# Patient Record
Sex: Female | Born: 1960 | Race: White | Hispanic: No | Marital: Married | Smoking: Former smoker
Health system: Southern US, Community
[De-identification: ages and names within clinical notes are randomized; demographics above are authoritative.]

## PROBLEM LIST (undated history)

## (undated) DIAGNOSIS — J449 Chronic obstructive pulmonary disease, unspecified: Secondary | ICD-10-CM

## (undated) HISTORY — PX: OTHER SURGICAL HISTORY: SHX169

## (undated) HISTORY — PX: CHOLECYSTECTOMY: SHX55

## (undated) HISTORY — PX: LUMBAR FUSION: SHX111

---

## 1983-06-11 HISTORY — PX: TOTAL ABDOMINAL HYSTERECTOMY: SHX209

## 2007-07-13 ENCOUNTER — Emergency Department: Payer: Self-pay | Admitting: Internal Medicine

## 2007-10-06 ENCOUNTER — Ambulatory Visit: Payer: Self-pay | Admitting: Internal Medicine

## 2008-01-18 ENCOUNTER — Ambulatory Visit: Payer: Self-pay | Admitting: Internal Medicine

## 2008-03-01 ENCOUNTER — Emergency Department: Payer: Self-pay | Admitting: Emergency Medicine

## 2009-04-28 ENCOUNTER — Ambulatory Visit: Payer: Self-pay | Admitting: Otolaryngology

## 2009-05-08 ENCOUNTER — Ambulatory Visit: Payer: Self-pay | Admitting: Otolaryngology

## 2010-06-20 ENCOUNTER — Ambulatory Visit: Payer: Self-pay | Admitting: Otolaryngology

## 2010-07-09 ENCOUNTER — Ambulatory Visit: Payer: Self-pay | Admitting: Otolaryngology

## 2011-01-31 ENCOUNTER — Emergency Department: Payer: Self-pay | Admitting: Internal Medicine

## 2011-02-19 ENCOUNTER — Inpatient Hospital Stay: Payer: Self-pay | Admitting: Internal Medicine

## 2011-05-16 ENCOUNTER — Ambulatory Visit: Payer: Self-pay | Admitting: Otolaryngology

## 2011-06-11 HISTORY — PX: ESOPHAGOGASTRODUODENOSCOPY: SHX1529

## 2011-06-11 HISTORY — PX: COLONOSCOPY: SHX174

## 2011-08-01 ENCOUNTER — Observation Stay: Payer: Self-pay | Admitting: *Deleted

## 2011-08-01 LAB — COMPREHENSIVE METABOLIC PANEL
Albumin: 4.1 g/dL (ref 3.4–5.0)
Alkaline Phosphatase: 70 U/L (ref 50–136)
Anion Gap: 8 (ref 7–16)
BUN: 5 mg/dL — ABNORMAL LOW (ref 7–18)
Calcium, Total: 9.5 mg/dL (ref 8.5–10.1)
Chloride: 103 mmol/L (ref 98–107)
Co2: 29 mmol/L (ref 21–32)
EGFR (Non-African Amer.): >60
Glucose: 83 mg/dL (ref 65–99)
Potassium: 3.5 mmol/L (ref 3.5–5.1)
SGOT(AST): 20 U/L (ref 15–37)
SGPT (ALT): 25 U/L
Total Protein: 7.5 g/dL (ref 6.4–8.2)

## 2011-08-01 LAB — LIPASE, BLOOD: Lipase: 171 U/L (ref 73–393)

## 2011-08-01 LAB — CBC WITH DIFFERENTIAL/PLATELET
Basophil %: 1.5 %
Eosinophil #: 0 10*3/uL (ref 0.0–0.7)
Eosinophil %: 0.9 %
HGB: 14 g/dL (ref 12.0–16.0)
Lymphocyte #: 2 10*3/uL (ref 1.0–3.6)
MCH: 30.8 pg (ref 26.0–34.0)
MCHC: 33.4 g/dL (ref 32.0–36.0)
MCV: 92 fL (ref 80–100)
Monocyte #: 0.4 10*3/uL (ref 0.0–0.7)
Neutrophil %: 53.4 %
Platelet: 135 10*3/uL — ABNORMAL LOW (ref 150–440)
RBC: 4.56 10*6/uL (ref 3.80–5.20)
RDW: 12.6 % (ref 11.5–14.5)

## 2011-08-01 LAB — URINALYSIS, COMPLETE
Glucose,UR: NEGATIVE mg/dL (ref 0–75)
Leukocyte Esterase: NEGATIVE
Nitrite: NEGATIVE
Ph: 6 (ref 4.5–8.0)
Protein: NEGATIVE
RBC,UR: <1 /HPF (ref 0–5)
Squamous Epithelial: 1
WBC UR: <1 /HPF (ref 0–5)

## 2011-08-02 LAB — COMPREHENSIVE METABOLIC PANEL
Alkaline Phosphatase: 55 U/L (ref 50–136)
Bilirubin,Total: 0.3 mg/dL (ref 0.2–1.0)
Calcium, Total: 8.2 mg/dL — ABNORMAL LOW (ref 8.5–10.1)
Chloride: 107 mmol/L (ref 98–107)
Co2: 29 mmol/L (ref 21–32)
Creatinine: 0.62 mg/dL (ref 0.60–1.30)
EGFR (African American): >60
EGFR (Non-African Amer.): >60
Glucose: 96 mg/dL (ref 65–99)
Osmolality: 290 (ref 275–301)
SGOT(AST): 38 U/L — ABNORMAL HIGH (ref 15–37)
SGPT (ALT): 25 U/L

## 2011-08-02 LAB — CBC WITH DIFFERENTIAL/PLATELET
Basophil #: 0 10*3/uL (ref 0.0–0.1)
Basophil %: 0.1 %
Eosinophil #: 0 10*3/uL (ref 0.0–0.7)
HGB: 12.1 g/dL (ref 12.0–16.0)
Lymphocyte %: 37.7 %
MCH: 31.6 pg (ref 26.0–34.0)
MCHC: 34.3 g/dL (ref 32.0–36.0)
Monocyte #: 0.4 10*3/uL (ref 0.0–0.7)
Monocyte %: 8.1 %
Neutrophil #: 2.6 10*3/uL (ref 1.4–6.5)
Neutrophil %: 53.5 %
Platelet: 113 10*3/uL — ABNORMAL LOW (ref 150–440)
RBC: 3.82 10*6/uL (ref 3.80–5.20)
RDW: 13.5 % (ref 11.5–14.5)
WBC: 4.9 10*3/uL (ref 3.6–11.0)

## 2011-08-03 LAB — BASIC METABOLIC PANEL
Anion Gap: 8 (ref 7–16)
BUN: 7 mg/dL (ref 7–18)
Chloride: 106 mmol/L (ref 98–107)
EGFR (African American): >60
EGFR (Non-African Amer.): >60
Glucose: 115 mg/dL — ABNORMAL HIGH (ref 65–99)
Osmolality: 282 (ref 275–301)

## 2011-09-09 ENCOUNTER — Emergency Department: Payer: Self-pay | Admitting: *Deleted

## 2011-09-09 LAB — COMPREHENSIVE METABOLIC PANEL
Albumin: 3.6 g/dL (ref 3.4–5.0)
Alkaline Phosphatase: 77 U/L (ref 50–136)
BUN: 12 mg/dL (ref 7–18)
Bilirubin,Total: 0.3 mg/dL (ref 0.2–1.0)
Creatinine: 0.65 mg/dL (ref 0.60–1.30)
Glucose: 79 mg/dL (ref 65–99)
SGOT(AST): 35 U/L (ref 15–37)
Total Protein: 7.2 g/dL (ref 6.4–8.2)

## 2011-09-09 LAB — URINALYSIS, COMPLETE
Blood: NEGATIVE
Glucose,UR: NEGATIVE mg/dL (ref 0–75)
Leukocyte Esterase: NEGATIVE
Ph: 5 (ref 4.5–8.0)
Protein: NEGATIVE
Specific Gravity: 1.012 (ref 1.003–1.030)
Squamous Epithelial: NONE SEEN
WBC UR: 1 /HPF (ref 0–5)

## 2011-09-09 LAB — CBC
HCT: 39.6 % (ref 35.0–47.0)
MCH: 31.6 pg (ref 26.0–34.0)
RDW: 13.4 % (ref 11.5–14.5)
WBC: 5.9 10*3/uL (ref 3.6–11.0)

## 2011-09-09 LAB — PROTIME-INR: Prothrombin Time: 12.6 secs (ref 11.5–14.7)

## 2011-09-09 LAB — APTT: Activated PTT: <23 secs — ABNORMAL LOW (ref 23.6–35.9)

## 2011-09-17 ENCOUNTER — Inpatient Hospital Stay: Payer: Self-pay | Admitting: Internal Medicine

## 2011-09-17 LAB — APTT: Activated PTT: 27.8 secs (ref 23.6–35.9)

## 2011-09-17 LAB — COMPREHENSIVE METABOLIC PANEL
Albumin: 3.4 g/dL (ref 3.4–5.0)
BUN: 6 mg/dL — ABNORMAL LOW (ref 7–18)
Co2: 24 mmol/L (ref 21–32)
Creatinine: 0.71 mg/dL (ref 0.60–1.30)
EGFR (African American): >60
EGFR (Non-African Amer.): >60
Glucose: 141 mg/dL — ABNORMAL HIGH (ref 65–99)
Potassium: 3.3 mmol/L — ABNORMAL LOW (ref 3.5–5.1)
SGOT(AST): 44 U/L — ABNORMAL HIGH (ref 15–37)
Sodium: 139 mmol/L (ref 136–145)

## 2011-09-17 LAB — URINALYSIS, COMPLETE
Bacteria: NONE SEEN
Bilirubin,UR: NEGATIVE
Blood: NEGATIVE
Glucose,UR: NEGATIVE mg/dL (ref 0–75)
Leukocyte Esterase: NEGATIVE
Nitrite: NEGATIVE
Protein: NEGATIVE
RBC,UR: <1 /HPF (ref 0–5)
Squamous Epithelial: <1
WBC UR: <1 /HPF (ref 0–5)

## 2011-09-17 LAB — LIPASE, BLOOD: Lipase: 135 U/L (ref 73–393)

## 2011-09-17 LAB — CBC
MCH: 31.9 pg (ref 26.0–34.0)
MCHC: 34.7 g/dL (ref 32.0–36.0)
MCV: 92 fL (ref 80–100)
Platelet: 141 10*3/uL — ABNORMAL LOW (ref 150–440)
RDW: 13.1 % (ref 11.5–14.5)

## 2011-09-17 LAB — HEMOGLOBIN: HGB: 10 g/dL — ABNORMAL LOW (ref 12.0–16.0)

## 2011-09-17 LAB — HEMATOCRIT: HCT: 28.9 % — ABNORMAL LOW (ref 35.0–47.0)

## 2011-09-18 LAB — CBC WITH DIFFERENTIAL/PLATELET
Basophil %: 0.2 %
MCH: 31.3 pg (ref 26.0–34.0)
MCV: 93 fL (ref 80–100)
Monocyte %: 11.2 %
Neutrophil #: 1 10*3/uL — ABNORMAL LOW (ref 1.4–6.5)
Neutrophil %: 32.4 %
RDW: 13.3 % (ref 11.5–14.5)

## 2011-09-18 LAB — BASIC METABOLIC PANEL
BUN: 4 mg/dL — ABNORMAL LOW (ref 7–18)
Calcium, Total: 8.2 mg/dL — ABNORMAL LOW (ref 8.5–10.1)
Chloride: 112 mmol/L — ABNORMAL HIGH (ref 98–107)
Co2: 27 mmol/L (ref 21–32)
EGFR (African American): >60
Glucose: 83 mg/dL (ref 65–99)
Osmolality: 287 (ref 275–301)
Sodium: 146 mmol/L — ABNORMAL HIGH (ref 136–145)

## 2011-09-18 LAB — PROTIME-INR: INR: 0.9

## 2011-09-18 LAB — MAGNESIUM: Magnesium: 1.6 mg/dL — ABNORMAL LOW

## 2011-10-08 ENCOUNTER — Ambulatory Visit: Payer: Self-pay | Admitting: Gastroenterology

## 2012-02-11 ENCOUNTER — Emergency Department: Payer: Self-pay | Admitting: Emergency Medicine

## 2012-02-11 LAB — URINALYSIS, COMPLETE
Hyaline Cast: 8
Nitrite: NEGATIVE
Protein: NEGATIVE
Specific Gravity: 1.012 (ref 1.003–1.030)
WBC UR: <1 /HPF (ref 0–5)

## 2012-02-11 LAB — COMPREHENSIVE METABOLIC PANEL
Albumin: 3.6 g/dL (ref 3.4–5.0)
BUN: 8 mg/dL (ref 7–18)
Bilirubin,Total: 0.2 mg/dL (ref 0.2–1.0)
Chloride: 106 mmol/L (ref 98–107)
Co2: 22 mmol/L (ref 21–32)
Creatinine: 0.68 mg/dL (ref 0.60–1.30)
EGFR (African American): >60
EGFR (Non-African Amer.): >60
Osmolality: 275 (ref 275–301)
Sodium: 138 mmol/L (ref 136–145)
Total Protein: 7.5 g/dL (ref 6.4–8.2)

## 2012-02-11 LAB — CBC
HCT: 38 % (ref 35.0–47.0)
MCV: 89 fL (ref 80–100)
RBC: 4.28 10*6/uL (ref 3.80–5.20)

## 2012-02-11 LAB — LIPASE, BLOOD: Lipase: 160 U/L (ref 73–393)

## 2012-02-13 LAB — URINE CULTURE

## 2012-02-18 ENCOUNTER — Other Ambulatory Visit: Payer: Self-pay | Admitting: Surgery

## 2012-02-18 LAB — CBC WITH DIFFERENTIAL/PLATELET
Basophil #: 0 10*3/uL (ref 0.0–0.1)
Lymphocyte #: 1.6 10*3/uL (ref 1.0–3.6)
Lymphocyte %: 36.3 %
MCHC: 35.6 g/dL (ref 32.0–36.0)
MCV: 89 fL (ref 80–100)
Monocyte %: 9.9 %
Neutrophil #: 2.2 10*3/uL (ref 1.4–6.5)
Neutrophil %: 50.5 %
Platelet: 146 10*3/uL — ABNORMAL LOW (ref 150–440)
RBC: 4.12 10*6/uL (ref 3.80–5.20)
RDW: 13.6 % (ref 11.5–14.5)
WBC: 4.3 10*3/uL (ref 3.6–11.0)

## 2012-02-18 LAB — COMPREHENSIVE METABOLIC PANEL
Anion Gap: 7 (ref 7–16)
Bilirubin,Total: 0.3 mg/dL (ref 0.2–1.0)
Calcium, Total: 9 mg/dL (ref 8.5–10.1)
Chloride: 105 mmol/L (ref 98–107)
Co2: 28 mmol/L (ref 21–32)
EGFR (African American): >60
Glucose: 110 mg/dL — ABNORMAL HIGH (ref 65–99)
Osmolality: 277 (ref 275–301)
Potassium: 3.6 mmol/L (ref 3.5–5.1)
SGOT(AST): 75 U/L — ABNORMAL HIGH (ref 15–37)
Sodium: 140 mmol/L (ref 136–145)

## 2012-02-19 ENCOUNTER — Ambulatory Visit: Payer: Self-pay | Admitting: Surgery

## 2012-02-21 ENCOUNTER — Ambulatory Visit: Payer: Self-pay | Admitting: Surgery

## 2012-06-17 ENCOUNTER — Ambulatory Visit: Payer: Self-pay | Admitting: Physician Assistant

## 2012-07-01 ENCOUNTER — Ambulatory Visit: Payer: Self-pay | Admitting: Gastroenterology

## 2012-11-13 LAB — COMPREHENSIVE METABOLIC PANEL
Alkaline Phosphatase: 77 U/L (ref 50–136)
BUN: 6 mg/dL — ABNORMAL LOW (ref 7–18)
Bilirubin,Total: 0.4 mg/dL (ref 0.2–1.0)
Chloride: 111 mmol/L — ABNORMAL HIGH (ref 98–107)
Creatinine: 0.54 mg/dL — ABNORMAL LOW (ref 0.60–1.30)
EGFR (African American): >60
Glucose: 83 mg/dL (ref 65–99)
Potassium: 3.7 mmol/L (ref 3.5–5.1)
SGPT (ALT): 25 U/L (ref 12–78)
Sodium: 144 mmol/L (ref 136–145)

## 2012-11-13 LAB — URINALYSIS, COMPLETE
Blood: NEGATIVE
Glucose,UR: NEGATIVE mg/dL (ref 0–75)
Ketone: NEGATIVE
Ph: 6 (ref 4.5–8.0)
Protein: NEGATIVE
RBC,UR: 1 /HPF (ref 0–5)
Specific Gravity: 1.017 (ref 1.003–1.030)
Squamous Epithelial: 2
WBC UR: 1 /HPF (ref 0–5)

## 2012-11-13 LAB — CBC
HCT: 45.8 % (ref 35.0–47.0)
MCH: 31.5 pg (ref 26.0–34.0)
Platelet: 191 10*3/uL (ref 150–440)
RBC: 5.12 10*6/uL (ref 3.80–5.20)
RDW: 13.9 % (ref 11.5–14.5)
WBC: 6.3 10*3/uL (ref 3.6–11.0)

## 2012-11-15 ENCOUNTER — Inpatient Hospital Stay: Payer: Self-pay | Admitting: Specialist

## 2012-11-17 LAB — CBC WITH DIFFERENTIAL/PLATELET
Basophil #: 0.1 10*3/uL (ref 0.0–0.1)
Eosinophil %: 4.6 %
HCT: 34.1 % — ABNORMAL LOW (ref 35.0–47.0)
HGB: 11.9 g/dL — ABNORMAL LOW (ref 12.0–16.0)
Lymphocyte #: 2 10*3/uL (ref 1.0–3.6)
Lymphocyte %: 38.9 %
MCH: 31.4 pg (ref 26.0–34.0)
MCHC: 34.8 g/dL (ref 32.0–36.0)
MCV: 90 fL (ref 80–100)
Monocyte #: 0.6 x10 3/mm (ref 0.2–0.9)
Monocyte %: 11.2 %
Neutrophil %: 44.3 %
RDW: 13.2 % (ref 11.5–14.5)
WBC: 5.2 10*3/uL (ref 3.6–11.0)

## 2012-11-17 LAB — PROTIME-INR
INR: 1.1
Prothrombin Time: 14.1 secs (ref 11.5–14.7)

## 2012-11-17 LAB — BASIC METABOLIC PANEL
Chloride: 109 mmol/L — ABNORMAL HIGH (ref 98–107)
Creatinine: 0.44 mg/dL — ABNORMAL LOW (ref 0.60–1.30)
EGFR (African American): >60
Osmolality: 286 (ref 275–301)
Potassium: 3 mmol/L — ABNORMAL LOW (ref 3.5–5.1)
Sodium: 146 mmol/L — ABNORMAL HIGH (ref 136–145)

## 2012-11-18 LAB — CBC WITH DIFFERENTIAL/PLATELET
Basophil #: 0 10*3/uL (ref 0.0–0.1)
Eosinophil %: 4.6 %
HCT: 33.6 % — ABNORMAL LOW (ref 35.0–47.0)
HGB: 12 g/dL (ref 12.0–16.0)
Lymphocyte #: 1.8 10*3/uL (ref 1.0–3.6)
Lymphocyte %: 38.6 %
MCH: 32 pg (ref 26.0–34.0)
MCHC: 35.8 g/dL (ref 32.0–36.0)
MCV: 89 fL (ref 80–100)
Monocyte #: 0.5 x10 3/mm (ref 0.2–0.9)
Neutrophil #: 2.1 10*3/uL (ref 1.4–6.5)
Platelet: 90 10*3/uL — ABNORMAL LOW (ref 150–440)
RBC: 3.76 10*6/uL — ABNORMAL LOW (ref 3.80–5.20)
WBC: 4.7 10*3/uL (ref 3.6–11.0)

## 2012-11-18 LAB — BASIC METABOLIC PANEL
Calcium, Total: 8.6 mg/dL (ref 8.5–10.1)
Chloride: 105 mmol/L (ref 98–107)
EGFR (African American): >60
EGFR (Non-African Amer.): >60

## 2012-11-18 LAB — COMPREHENSIVE METABOLIC PANEL
Alkaline Phosphatase: 75 U/L (ref 50–136)
SGOT(AST): 31 U/L (ref 15–37)
SGPT (ALT): 31 U/L (ref 12–78)
Total Protein: 6.5 g/dL (ref 6.4–8.2)

## 2012-11-18 LAB — PATHOLOGY REPORT

## 2012-11-19 LAB — CBC WITH DIFFERENTIAL/PLATELET
Basophil #: 0 10*3/uL (ref 0.0–0.1)
Basophil %: 0.1 %
Eosinophil #: 0 10*3/uL (ref 0.0–0.7)
HCT: 33.9 % — ABNORMAL LOW (ref 35.0–47.0)
HGB: 12 g/dL (ref 12.0–16.0)
Lymphocyte #: 0.4 10*3/uL — ABNORMAL LOW (ref 1.0–3.6)
Lymphocyte %: 10.7 %
MCHC: 35.3 g/dL (ref 32.0–36.0)
MCV: 90 fL (ref 80–100)
Monocyte #: 0.2 x10 3/mm (ref 0.2–0.9)
Monocyte %: 5.4 %
Platelet: 85 10*3/uL — ABNORMAL LOW (ref 150–440)
RBC: 3.77 10*6/uL — ABNORMAL LOW (ref 3.80–5.20)
RDW: 13.3 % (ref 11.5–14.5)
WBC: 3.5 10*3/uL — ABNORMAL LOW (ref 3.6–11.0)

## 2012-11-19 LAB — MAGNESIUM: Magnesium: 1.8 mg/dL

## 2012-11-19 LAB — COMPREHENSIVE METABOLIC PANEL
Albumin: 3.2 g/dL — ABNORMAL LOW (ref 3.4–5.0)
Alkaline Phosphatase: 71 U/L (ref 50–136)
BUN: 3 mg/dL — ABNORMAL LOW (ref 7–18)
Calcium, Total: 9 mg/dL (ref 8.5–10.1)
Chloride: 103 mmol/L (ref 98–107)
Co2: 33 mmol/L — ABNORMAL HIGH (ref 21–32)
Creatinine: 0.68 mg/dL (ref 0.60–1.30)
EGFR (African American): >60
EGFR (Non-African Amer.): >60
Potassium: 3.8 mmol/L (ref 3.5–5.1)
SGOT(AST): 31 U/L (ref 15–37)

## 2012-11-22 LAB — CBC WITH DIFFERENTIAL/PLATELET
Basophil #: 0 10*3/uL (ref 0.0–0.1)
Basophil %: 0.8 %
Eosinophil %: 4.8 %
HCT: 33.4 % — ABNORMAL LOW (ref 35.0–47.0)
HGB: 11.7 g/dL — ABNORMAL LOW (ref 12.0–16.0)
Lymphocyte #: 1.3 10*3/uL (ref 1.0–3.6)
MCH: 31.8 pg (ref 26.0–34.0)
Monocyte %: 13.1 %
Neutrophil %: 52.1 %
Platelet: 101 10*3/uL — ABNORMAL LOW (ref 150–440)
RBC: 3.7 10*6/uL — ABNORMAL LOW (ref 3.80–5.20)
RDW: 13.6 % (ref 11.5–14.5)
WBC: 4.4 10*3/uL (ref 3.6–11.0)

## 2012-11-23 LAB — TPN PANEL
Activated PTT: 28.2 secs (ref 23.6–35.9)
Anion Gap: 4 — ABNORMAL LOW (ref 7–16)
BUN: 4 mg/dL — ABNORMAL LOW (ref 7–18)
Calcium, Total: 8.6 mg/dL (ref 8.5–10.1)
Chloride: 103 mmol/L (ref 98–107)
Cholesterol: 148 mg/dL (ref 0–200)
Co2: 33 mmol/L — ABNORMAL HIGH (ref 21–32)
Creatinine: 0.56 mg/dL — ABNORMAL LOW (ref 0.60–1.30)
EGFR (Non-African Amer.): >60
Glucose: 132 mg/dL — ABNORMAL HIGH (ref 65–99)
HGB: 11.9 g/dL — ABNORMAL LOW (ref 12.0–16.0)
INR: 0.9
Osmolality: 278 (ref 275–301)
Phosphorus: 3.8 mg/dL (ref 2.5–4.9)
Prothrombin Time: 12.3 secs (ref 11.5–14.7)
SGOT(AST): 28 U/L (ref 15–37)
Sodium: 140 mmol/L (ref 136–145)
Total Protein: 6.3 g/dL — ABNORMAL LOW (ref 6.4–8.2)
Triglycerides: 148 mg/dL (ref 0–200)
WBC: 5.1 10*3/uL (ref 3.6–11.0)

## 2012-11-23 LAB — PATHOLOGY REPORT

## 2012-11-24 LAB — MAGNESIUM: Magnesium: 1.6 mg/dL — ABNORMAL LOW

## 2012-11-24 LAB — BASIC METABOLIC PANEL
Chloride: 102 mmol/L (ref 98–107)
Creatinine: 0.59 mg/dL — ABNORMAL LOW (ref 0.60–1.30)
EGFR (African American): >60
Potassium: 3.9 mmol/L (ref 3.5–5.1)

## 2012-11-25 LAB — SODIUM: Sodium: 138 mmol/L (ref 136–145)

## 2012-11-25 LAB — MAGNESIUM: Magnesium: 1.9 mg/dL

## 2012-11-25 LAB — PHOSPHORUS: Phosphorus: 4.7 mg/dL (ref 2.5–4.9)

## 2012-11-25 LAB — POTASSIUM: Potassium: 4 mmol/L (ref 3.5–5.1)

## 2012-11-26 LAB — ALBUMIN: Albumin: 3 g/dL — ABNORMAL LOW (ref 3.4–5.0)

## 2012-11-26 LAB — BASIC METABOLIC PANEL
Anion Gap: 3 — ABNORMAL LOW (ref 7–16)
BUN: 6 mg/dL — ABNORMAL LOW (ref 7–18)
Calcium, Total: 8.6 mg/dL (ref 8.5–10.1)
Co2: 33 mmol/L — ABNORMAL HIGH (ref 21–32)
EGFR (African American): >60
Potassium: 4 mmol/L (ref 3.5–5.1)
Sodium: 142 mmol/L (ref 136–145)

## 2012-11-26 LAB — PHOSPHORUS: Phosphorus: 4.2 mg/dL (ref 2.5–4.9)

## 2012-11-26 LAB — MAGNESIUM: Magnesium: 1.4 mg/dL — ABNORMAL LOW

## 2012-11-27 LAB — BASIC METABOLIC PANEL
EGFR (Non-African Amer.): >60
Glucose: 103 mg/dL — ABNORMAL HIGH (ref 65–99)
Osmolality: 281 (ref 275–301)
Potassium: 4.1 mmol/L (ref 3.5–5.1)

## 2012-11-27 LAB — MAGNESIUM: Magnesium: 1.7 mg/dL — ABNORMAL LOW

## 2012-11-28 LAB — POTASSIUM: Potassium: 3.9 mmol/L (ref 3.5–5.1)

## 2012-11-28 LAB — SODIUM: Sodium: 142 mmol/L (ref 136–145)

## 2012-11-28 LAB — CALCIUM: Calcium, Total: 8.6 mg/dL (ref 8.5–10.1)

## 2012-11-28 LAB — PHOSPHORUS: Phosphorus: 4.4 mg/dL (ref 2.5–4.9)

## 2012-11-29 LAB — SODIUM: Sodium: 142 mmol/L (ref 136–145)

## 2012-11-29 LAB — MAGNESIUM: Magnesium: 1.6 mg/dL — ABNORMAL LOW

## 2012-11-29 LAB — CALCIUM: Calcium, Total: 8.6 mg/dL (ref 8.5–10.1)

## 2012-11-30 LAB — SODIUM: Sodium: 142 mmol/L (ref 136–145)

## 2012-11-30 LAB — CALCIUM: Calcium, Total: 8.3 mg/dL — ABNORMAL LOW (ref 8.5–10.1)

## 2012-11-30 LAB — PHOSPHORUS: Phosphorus: 4.1 mg/dL (ref 2.5–4.9)

## 2012-11-30 LAB — MAGNESIUM: Magnesium: 2.1 mg/dL

## 2012-12-01 LAB — BASIC METABOLIC PANEL
Anion Gap: 5 — ABNORMAL LOW (ref 7–16)
Chloride: 103 mmol/L (ref 98–107)
Co2: 32 mmol/L (ref 21–32)
EGFR (Non-African Amer.): >60
Glucose: 98 mg/dL (ref 65–99)
Osmolality: 279 (ref 275–301)
Potassium: 3.8 mmol/L (ref 3.5–5.1)
Sodium: 140 mmol/L (ref 136–145)

## 2012-12-01 LAB — MAGNESIUM: Magnesium: 1.6 mg/dL — ABNORMAL LOW

## 2012-12-01 LAB — CALCIUM: Calcium, Total: 8.6 mg/dL (ref 8.5–10.1)

## 2012-12-01 LAB — ALBUMIN: Albumin: 2.9 g/dL — ABNORMAL LOW (ref 3.4–5.0)

## 2012-12-02 LAB — SODIUM: Sodium: 142 mmol/L (ref 136–145)

## 2012-12-02 LAB — POTASSIUM: Potassium: 3.8 mmol/L (ref 3.5–5.1)

## 2012-12-02 LAB — CALCIUM: Calcium, Total: 8.8 mg/dL (ref 8.5–10.1)

## 2013-05-14 IMAGING — CR DG ABDOMEN 3V
1 series · 4 of 4 positions shown · non-contrast
Comparison: none

REASON FOR EXAM: pain, vomiting
COMMENTS:

[Series 1: w chest pa · 0.14mm/px · 4 of 4 slices shown]
[im 1/4]
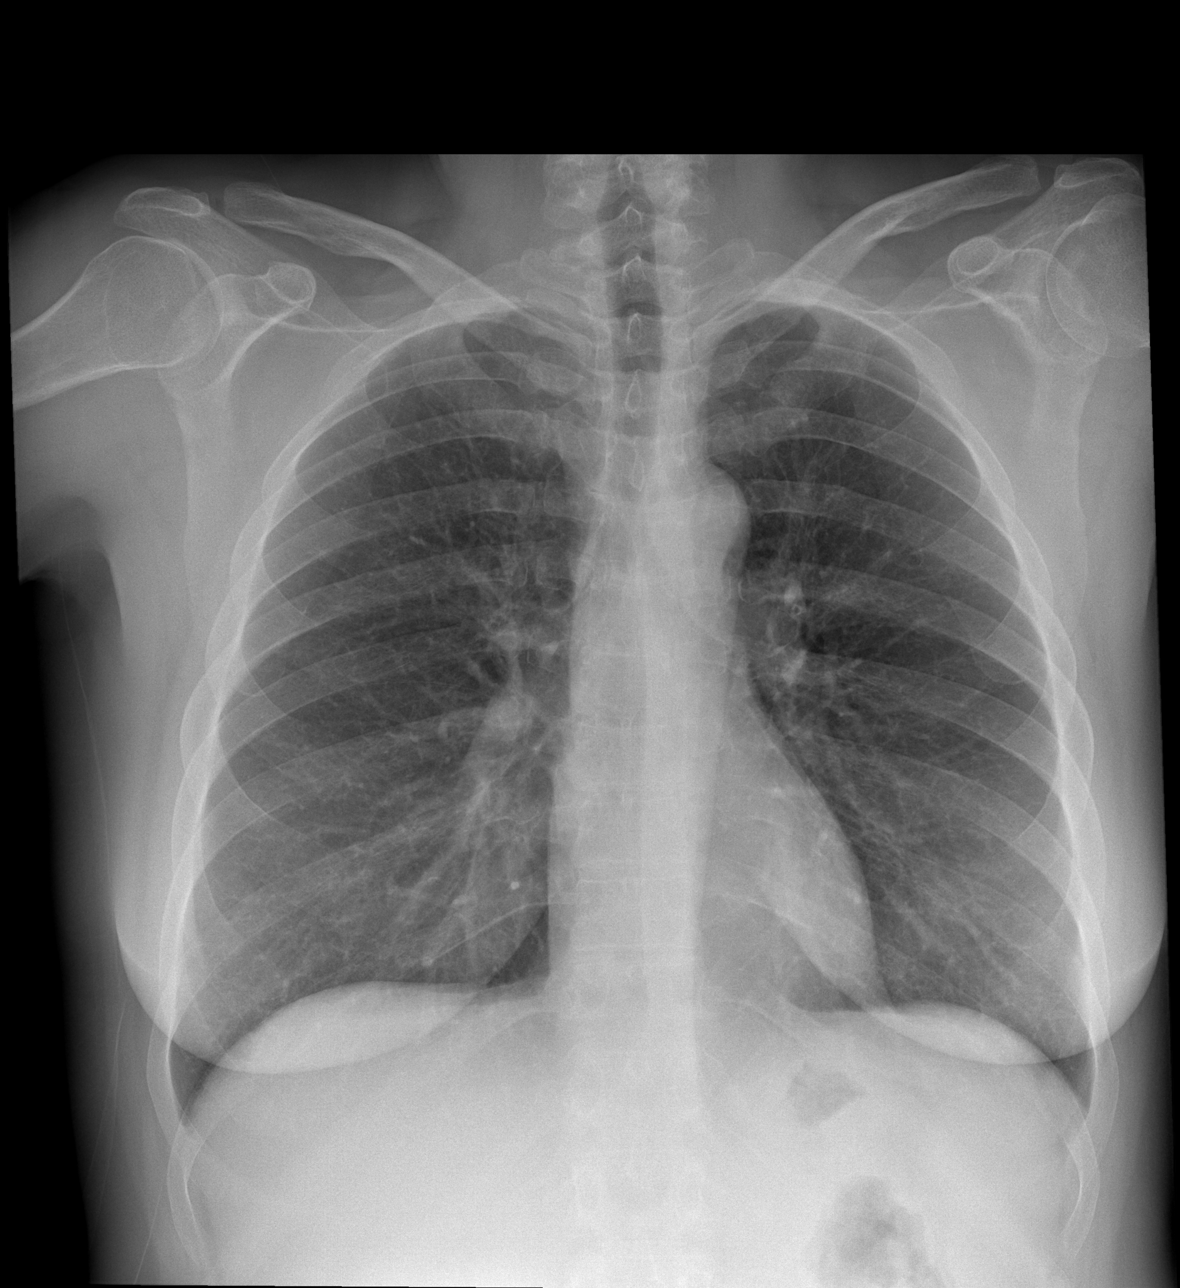
[im 2/4]
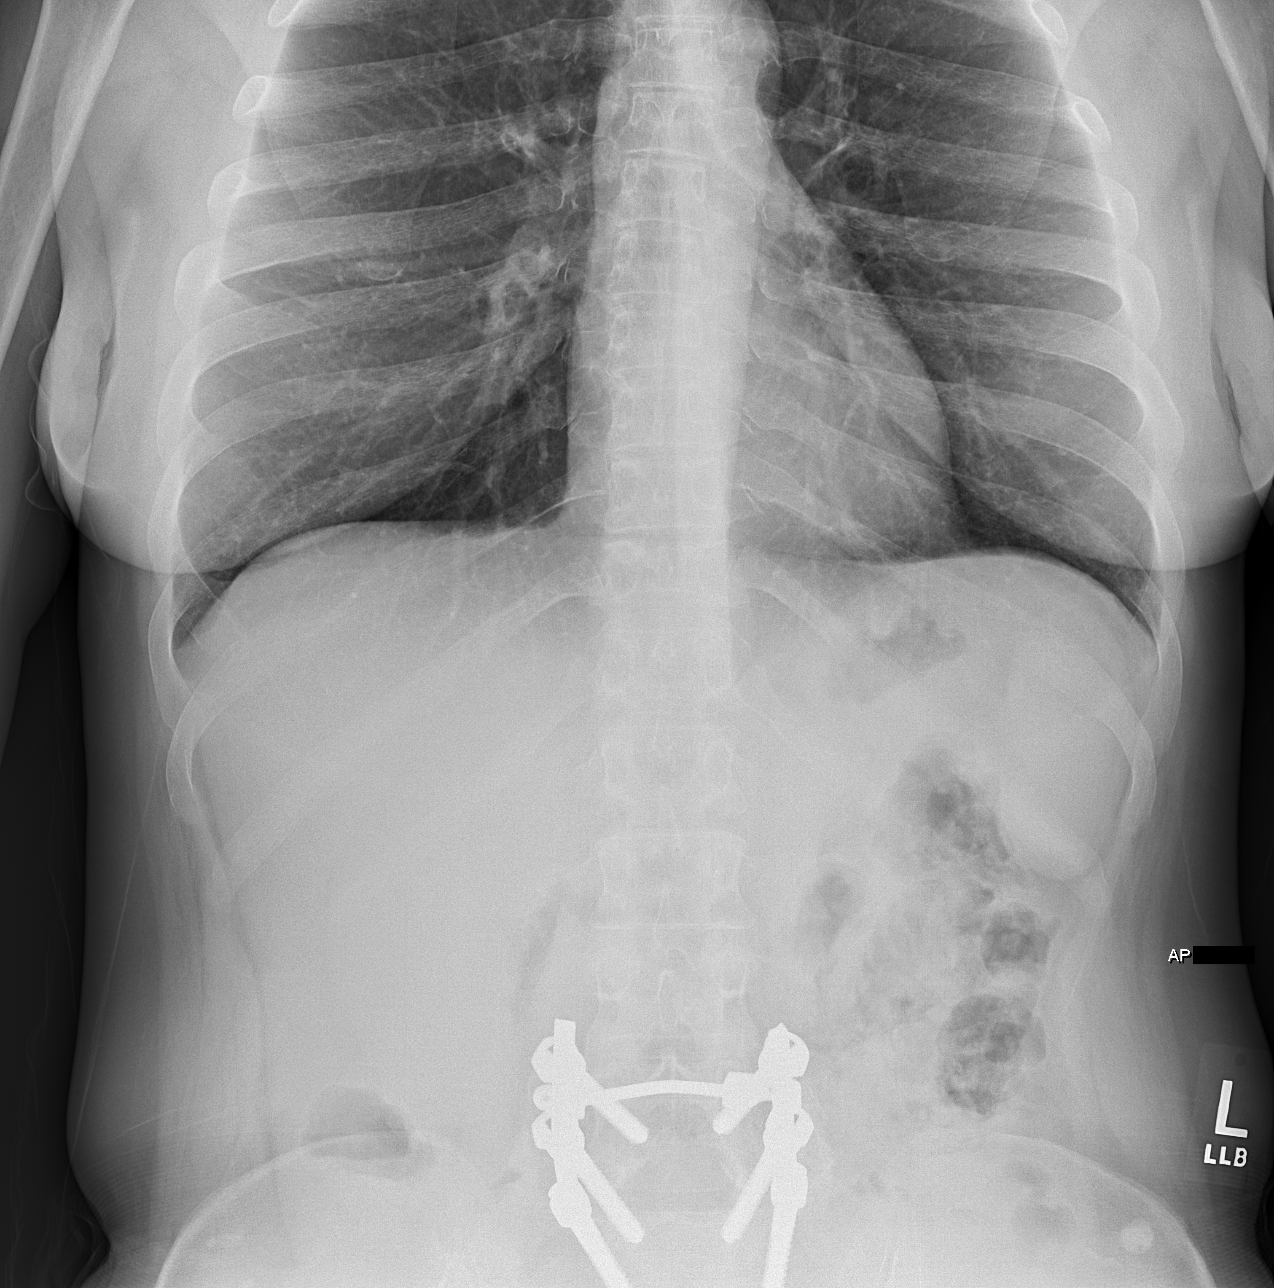
[im 3/4]
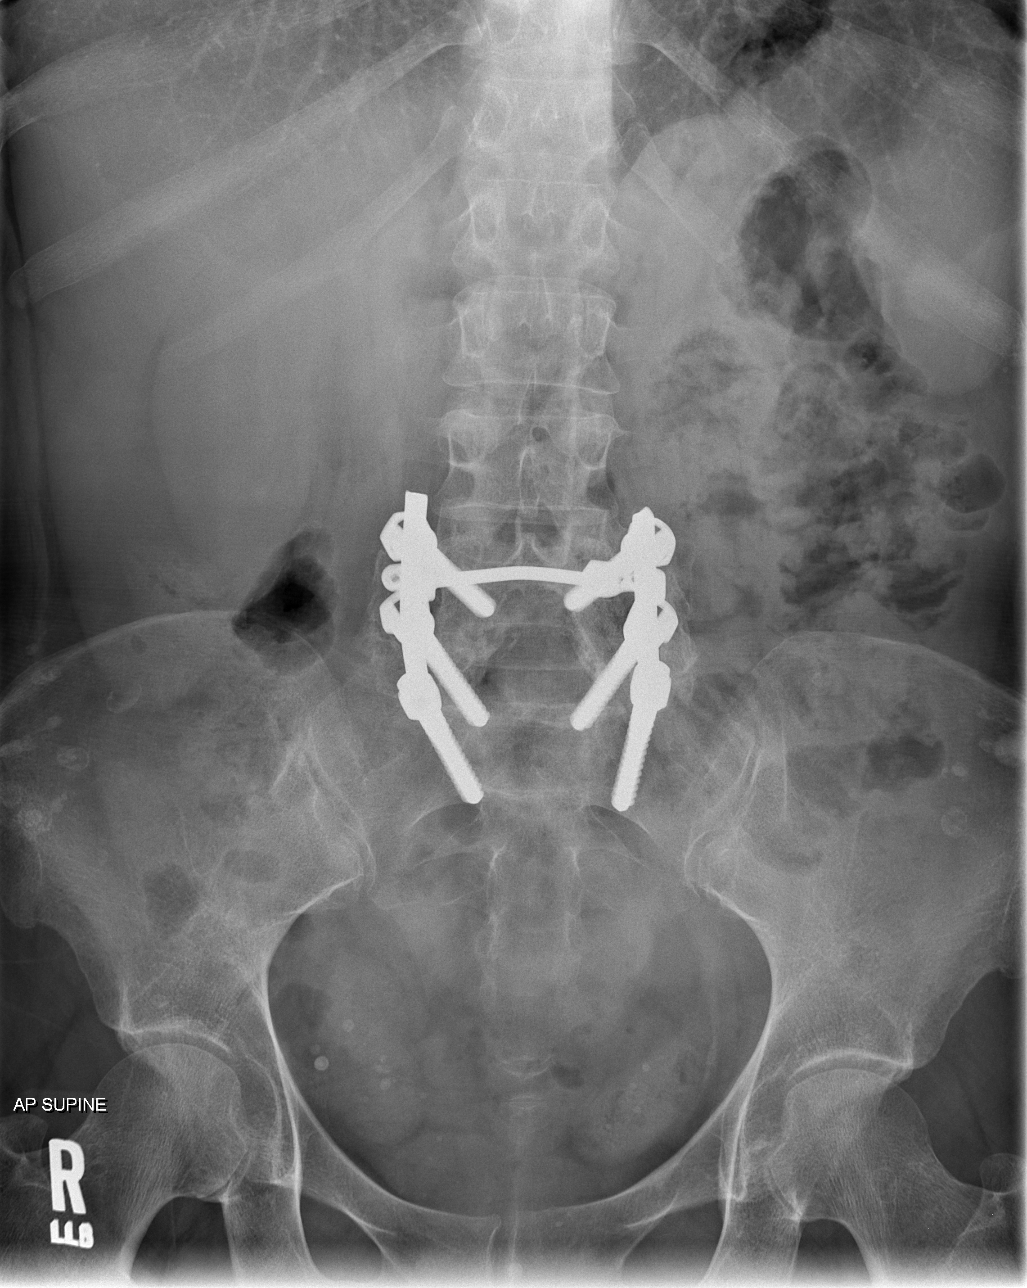
[im 4/4]
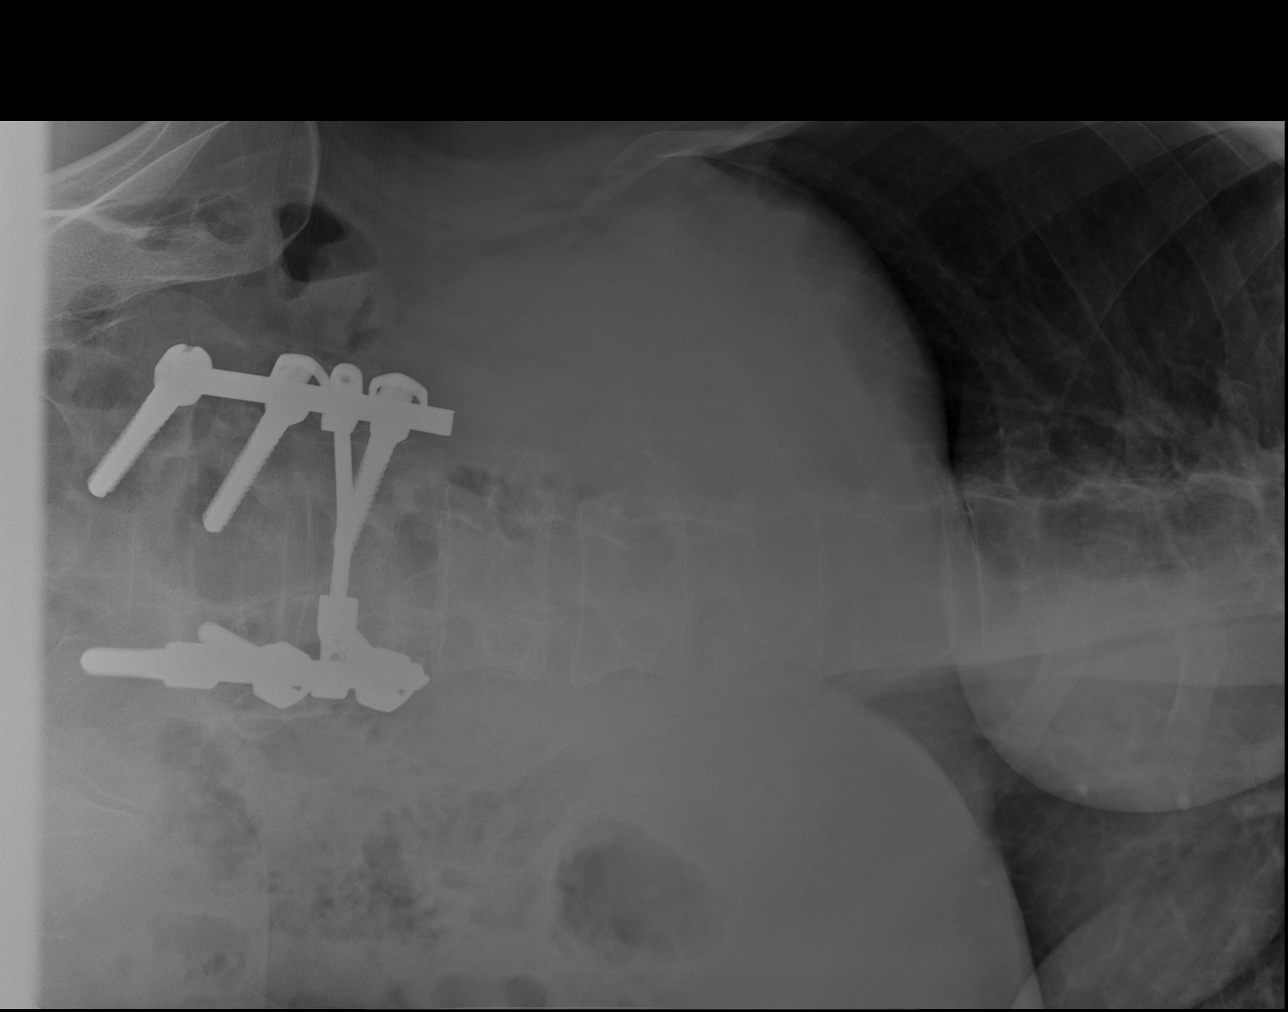

[4 of 4 positions shown; findings below may reference images not displayed]

PROCEDURE:     DXR - DXR ABDOMEN 3-WAY (INCL PA CXR)  - September 09, 2011  [DATE]

RESULT:     Comparison is made to the study 18 February, 2011.

Lungs are clear. The heart and pulmonary vessels are normal. There is no
intraperitoneal free air or abnormal bowel distention. Postoperative
stabilization hardware is seen in the lumbosacral junction. Phleboliths are
seen in the pelvis.
IMPRESSION: 1. No bowel of obstruction or perforation.
2. No acute cardiopulmonary disease.

## 2013-06-08 ENCOUNTER — Emergency Department: Payer: Self-pay | Admitting: Emergency Medicine

## 2013-06-08 LAB — URINALYSIS, COMPLETE
Bilirubin,UR: NEGATIVE
Blood: NEGATIVE
Glucose,UR: NEGATIVE mg/dL (ref 0–75)
Hyaline Cast: 3
Ketone: NEGATIVE
Nitrite: NEGATIVE
Ph: 5 (ref 4.5–8.0)
RBC,UR: 1 /HPF (ref 0–5)

## 2013-06-08 LAB — CBC WITH DIFFERENTIAL/PLATELET
Basophil #: 0 10*3/uL (ref 0.0–0.1)
Basophil %: 0.4 %
Eosinophil #: 0 10*3/uL (ref 0.0–0.7)
Eosinophil %: 0.3 %
Lymphocyte #: 1.3 10*3/uL (ref 1.0–3.6)
Lymphocyte %: 38.4 %
MCH: 31.3 pg (ref 26.0–34.0)
MCHC: 34.9 g/dL (ref 32.0–36.0)
Monocyte #: 0.3 x10 3/mm (ref 0.2–0.9)
Monocyte %: 10 %
Neutrophil #: 1.7 10*3/uL (ref 1.4–6.5)
Neutrophil %: 50.9 %

## 2013-06-08 LAB — COMPREHENSIVE METABOLIC PANEL
Alkaline Phosphatase: 94 U/L
BUN: 9 mg/dL (ref 7–18)
Bilirubin,Total: 0.3 mg/dL (ref 0.2–1.0)
Chloride: 97 mmol/L — ABNORMAL LOW (ref 98–107)
Co2: 30 mmol/L (ref 21–32)
EGFR (African American): >60
Glucose: 66 mg/dL (ref 65–99)
Osmolality: 261 (ref 275–301)
SGPT (ALT): 30 U/L (ref 12–78)
Sodium: 132 mmol/L — ABNORMAL LOW (ref 136–145)

## 2013-06-08 LAB — TROPONIN I: Troponin-I: <0.02 ng/mL

## 2013-06-13 ENCOUNTER — Emergency Department: Payer: Self-pay | Admitting: Internal Medicine

## 2013-06-13 LAB — URINALYSIS, COMPLETE
BACTERIA: NONE SEEN
BILIRUBIN, UR: NEGATIVE
Blood: NEGATIVE
GLUCOSE, UR: NEGATIVE mg/dL (ref 0–75)
KETONE: NEGATIVE
Nitrite: NEGATIVE
PH: 5 (ref 4.5–8.0)
Protein: NEGATIVE
RBC,UR: <1 /HPF (ref 0–5)
Specific Gravity: 1.005 (ref 1.003–1.030)
Squamous Epithelial: <1

## 2013-06-13 LAB — COMPREHENSIVE METABOLIC PANEL
ALBUMIN: 3.8 g/dL (ref 3.4–5.0)
Alkaline Phosphatase: 93 U/L
Anion Gap: 1 — ABNORMAL LOW (ref 7–16)
BUN: 6 mg/dL — ABNORMAL LOW (ref 7–18)
Bilirubin,Total: 0.2 mg/dL (ref 0.2–1.0)
Calcium, Total: 9.2 mg/dL (ref 8.5–10.1)
Chloride: 104 mmol/L (ref 98–107)
Co2: 31 mmol/L (ref 21–32)
Creatinine: 0.79 mg/dL (ref 0.60–1.30)
EGFR (Non-African Amer.): >60
Glucose: 79 mg/dL (ref 65–99)
Osmolality: 268 (ref 275–301)
Potassium: 4.1 mmol/L (ref 3.5–5.1)
SGOT(AST): 32 U/L (ref 15–37)
SGPT (ALT): 27 U/L (ref 12–78)
SODIUM: 136 mmol/L (ref 136–145)
TOTAL PROTEIN: 8 g/dL (ref 6.4–8.2)

## 2013-06-13 LAB — CBC WITH DIFFERENTIAL/PLATELET
BASOS PCT: 0.5 %
Basophil #: 0 10*3/uL (ref 0.0–0.1)
EOS PCT: 0.5 %
Eosinophil #: 0 10*3/uL (ref 0.0–0.7)
HCT: 42.7 % (ref 35.0–47.0)
HGB: 14.9 g/dL (ref 12.0–16.0)
LYMPHS PCT: 42.8 %
Lymphocyte #: 2.7 10*3/uL (ref 1.0–3.6)
MCH: 31.6 pg (ref 26.0–34.0)
MCHC: 34.8 g/dL (ref 32.0–36.0)
MCV: 91 fL (ref 80–100)
MONO ABS: 0.4 x10 3/mm (ref 0.2–0.9)
MONOS PCT: 6.5 %
NEUTROS PCT: 49.7 %
Neutrophil #: 3.2 10*3/uL (ref 1.4–6.5)
Platelet: 149 10*3/uL — ABNORMAL LOW (ref 150–440)
RBC: 4.71 10*6/uL (ref 3.80–5.20)
RDW: 12.9 % (ref 11.5–14.5)
WBC: 6.4 10*3/uL (ref 3.6–11.0)

## 2013-06-13 LAB — LIPASE, BLOOD: Lipase: 137 U/L (ref 73–393)

## 2013-06-15 ENCOUNTER — Ambulatory Visit: Payer: Self-pay | Admitting: Unknown Physician Specialty

## 2013-06-15 LAB — HEPATIC FUNCTION PANEL A (ARMC)
ALBUMIN: 3.3 g/dL — AB (ref 3.4–5.0)
AST: 28 U/L (ref 15–37)
Alkaline Phosphatase: 85 U/L
BILIRUBIN DIRECT: 0.1 mg/dL (ref 0.00–0.20)
BILIRUBIN TOTAL: 0.2 mg/dL (ref 0.2–1.0)
SGPT (ALT): 23 U/L (ref 12–78)
Total Protein: 7.1 g/dL (ref 6.4–8.2)

## 2013-06-15 LAB — CBC WITH DIFFERENTIAL/PLATELET
BASOS ABS: 0 10*3/uL (ref 0.0–0.1)
Basophil %: 0.3 %
EOS PCT: 0.6 %
Eosinophil #: 0 10*3/uL (ref 0.0–0.7)
HCT: 37.3 % (ref 35.0–47.0)
HGB: 13.1 g/dL (ref 12.0–16.0)
Lymphocyte #: 1.5 10*3/uL (ref 1.0–3.6)
Lymphocyte %: 33.7 %
MCH: 31.3 pg (ref 26.0–34.0)
MCHC: 35.2 g/dL (ref 32.0–36.0)
MCV: 89 fL (ref 80–100)
MONOS PCT: 6.1 %
Monocyte #: 0.3 x10 3/mm (ref 0.2–0.9)
NEUTROS PCT: 59.3 %
Neutrophil #: 2.7 10*3/uL (ref 1.4–6.5)
PLATELETS: 122 10*3/uL — AB (ref 150–440)
RBC: 4.19 10*6/uL (ref 3.80–5.20)
RDW: 12.5 % (ref 11.5–14.5)
WBC: 4.5 10*3/uL (ref 3.6–11.0)

## 2013-06-15 LAB — BASIC METABOLIC PANEL
ANION GAP: 4 — AB (ref 7–16)
BUN: 5 mg/dL — ABNORMAL LOW (ref 7–18)
CALCIUM: 9.2 mg/dL (ref 8.5–10.1)
Chloride: 106 mmol/L (ref 98–107)
Co2: 26 mmol/L (ref 21–32)
Creatinine: 0.83 mg/dL (ref 0.60–1.30)
EGFR (Non-African Amer.): >60
GLUCOSE: 114 mg/dL — AB (ref 65–99)
Osmolality: 270 (ref 275–301)
Potassium: 3.4 mmol/L — ABNORMAL LOW (ref 3.5–5.1)
Sodium: 136 mmol/L (ref 136–145)

## 2013-06-15 LAB — AMYLASE: Amylase: 32 U/L (ref 25–115)

## 2013-06-15 LAB — LIPASE, BLOOD: Lipase: 113 U/L (ref 73–393)

## 2013-06-15 LAB — SEDIMENTATION RATE: Erythrocyte Sed Rate: 28 mm/hr (ref 0–30)

## 2014-07-03 ENCOUNTER — Emergency Department: Payer: Self-pay | Admitting: Internal Medicine

## 2014-07-03 LAB — COMPREHENSIVE METABOLIC PANEL
ALK PHOS: 79 U/L
AST: 50 U/L — AB (ref 15–37)
Albumin: 3.6 g/dL (ref 3.4–5.0)
Anion Gap: 6 — ABNORMAL LOW (ref 7–16)
BUN: 6 mg/dL — ABNORMAL LOW (ref 7–18)
Bilirubin,Total: 0.3 mg/dL (ref 0.2–1.0)
CALCIUM: 8.7 mg/dL (ref 8.5–10.1)
CO2: 29 mmol/L (ref 21–32)
CREATININE: 0.59 mg/dL — AB (ref 0.60–1.30)
Chloride: 100 mmol/L (ref 98–107)
Glucose: 81 mg/dL (ref 65–99)
Osmolality: 267 (ref 275–301)
Potassium: 4.5 mmol/L (ref 3.5–5.1)
SGPT (ALT): 32 U/L
Sodium: 135 mmol/L — ABNORMAL LOW (ref 136–145)
Total Protein: 7.8 g/dL (ref 6.4–8.2)

## 2014-07-03 LAB — CBC
HCT: 40.6 % (ref 35.0–47.0)
HGB: 13.8 g/dL (ref 12.0–16.0)
MCH: 31.1 pg (ref 26.0–34.0)
MCHC: 33.9 g/dL (ref 32.0–36.0)
MCV: 92 fL (ref 80–100)
PLATELETS: 174 10*3/uL (ref 150–440)
RBC: 4.43 10*6/uL (ref 3.80–5.20)
RDW: 12.9 % (ref 11.5–14.5)
WBC: 5.5 10*3/uL (ref 3.6–11.0)

## 2014-07-03 LAB — LIPASE, BLOOD: Lipase: 231 U/L (ref 73–393)

## 2014-07-31 IMAGING — CR DG ABDOMEN 1V
1 series · 2 of 2 positions shown · non-contrast
Comparison: none

REASON FOR EXAM: relentless nausea, vomiting
COMMENTS:

[Series 1: supine kub · 0.17mm/px · 2 of 2 slices shown]
[im 1/2]
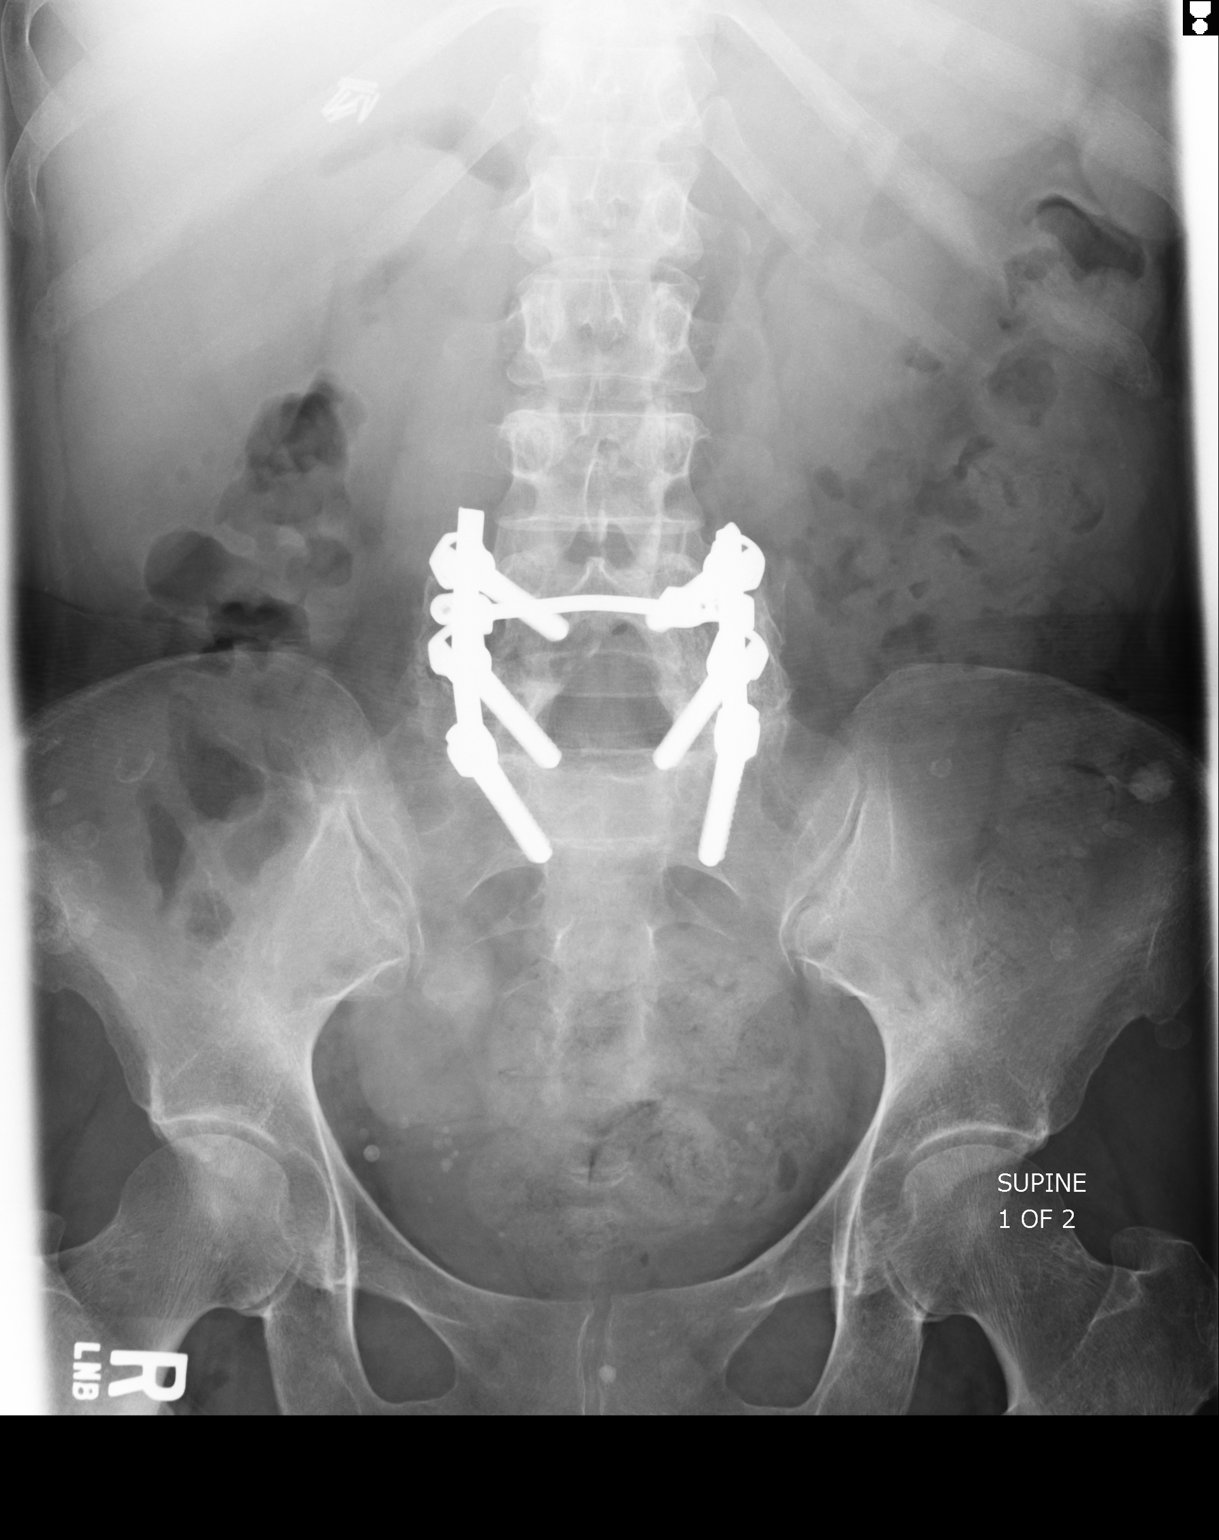
[im 2/2]
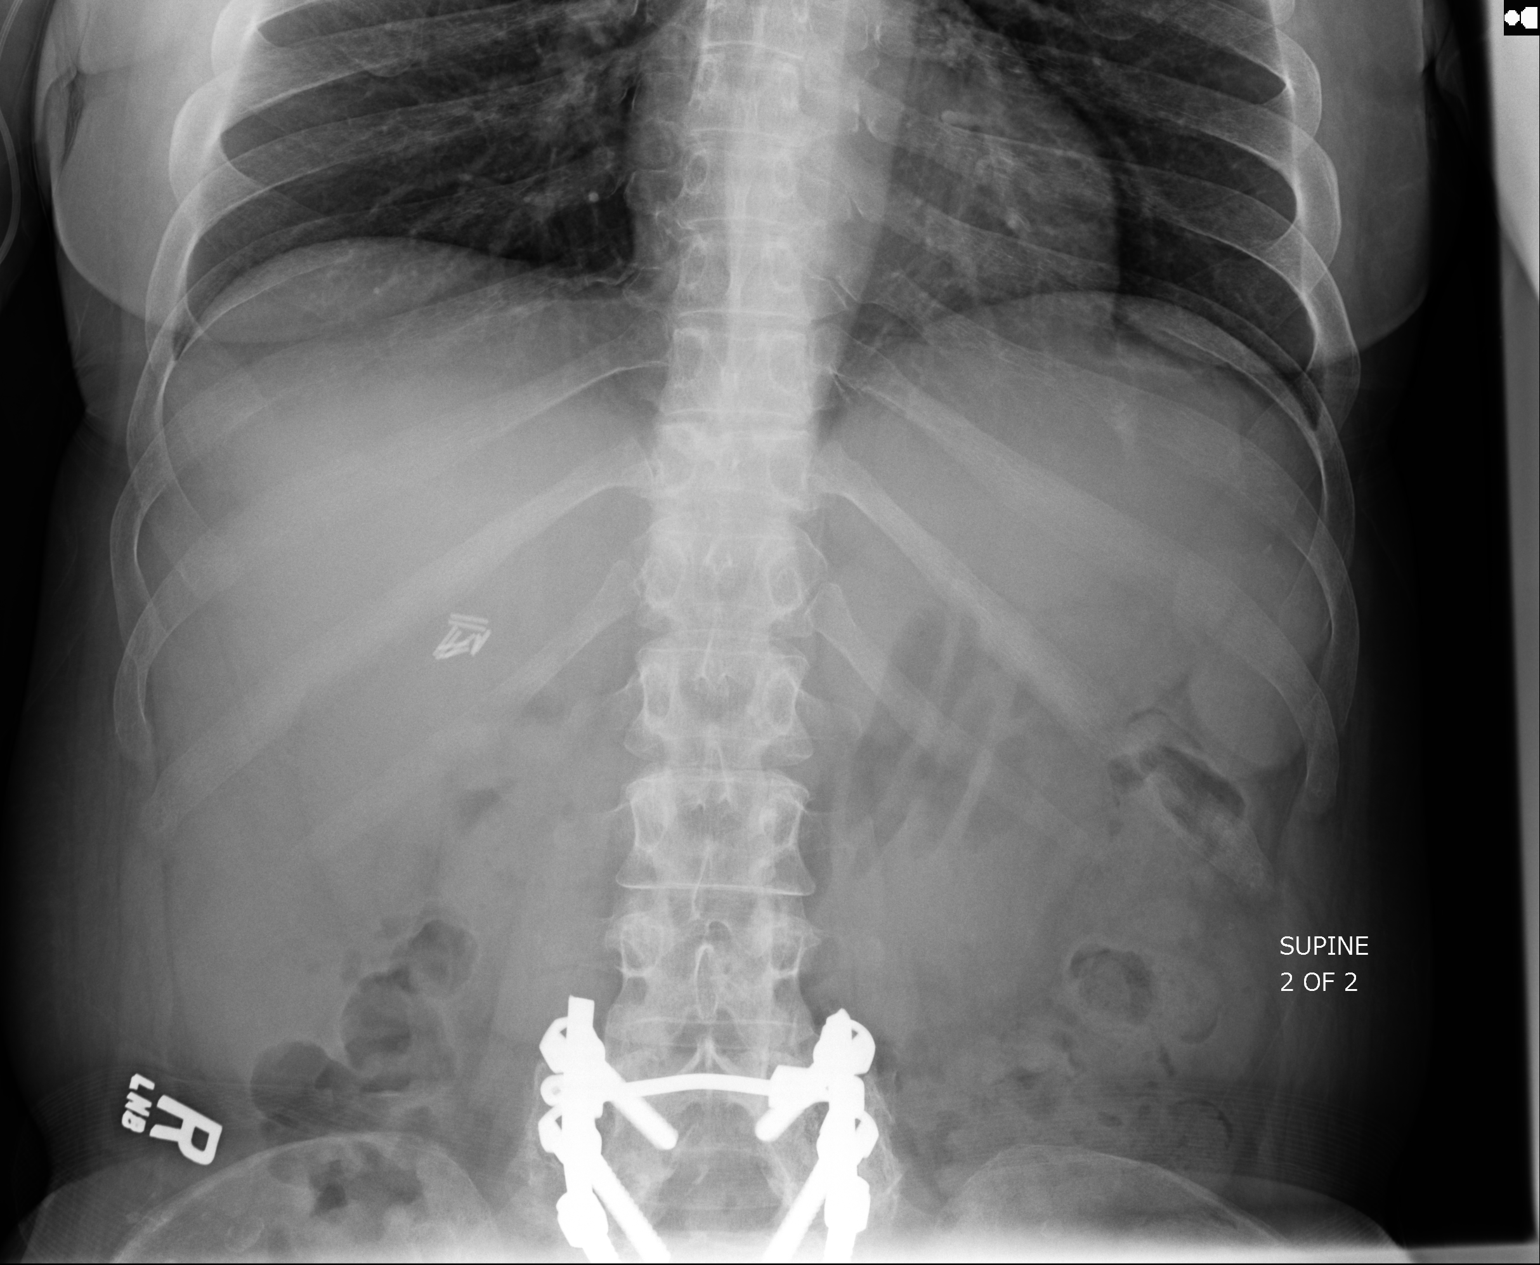

[2 of 2 positions shown; findings below may reference images not displayed]

PROCEDURE:     DXR - DXR KIDNEY URETER BLADDER  - November 25, 2012  [DATE]

RESULT:     There is air and fecal material scattered through the colon to
the rectum. Cholecystectomy clips are present. Stomach is nondistended.
There is no abnormal colonic small bowel distention. The lung bases are
clear. Spinal hardware is seen with pedicle screws and stabilization rods
over the lower lumbosacral region. Phleboliths are seen in the pelvic area.
IMPRESSION: No evidence of bowel obstruction or perforation.
Postoperative changes as described.

[REDACTED]

## 2014-08-05 IMAGING — CT CT ABD-PELV W/ CM
1 of 2 series · 15 of 32 positions shown, 19 images · IV contrast (isovue)
Comparison: None

REASON FOR EXAM: (1) Persistant N/V; (2) Persistant N/V
COMMENTS:

PROCEDURE:     CT  - CT ABDOMEN / PELVIS  W  - November 30, 2012  [DATE]
RESULT:     History: Nausea vomiting
TECHNIQUE: Multiple axial images of the abdomen and pelvis were performed
from the lung bases to the pubic symphysis, with p.o. contrast and with 100
ml of Isovue 370 intravenous contrast.

[Series 2: 3mm soft tissue · axial · 0.83mm/px · z∈[-1052,-662]mm · 15 of 144 slices shown, 19 images]
[im 7/144  soft-tissue]
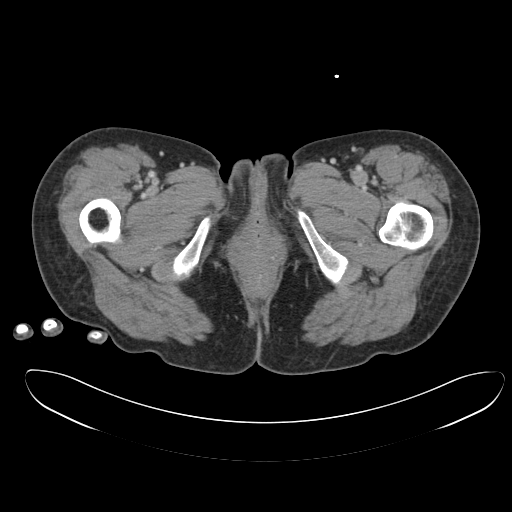
[im 7/144  bone]
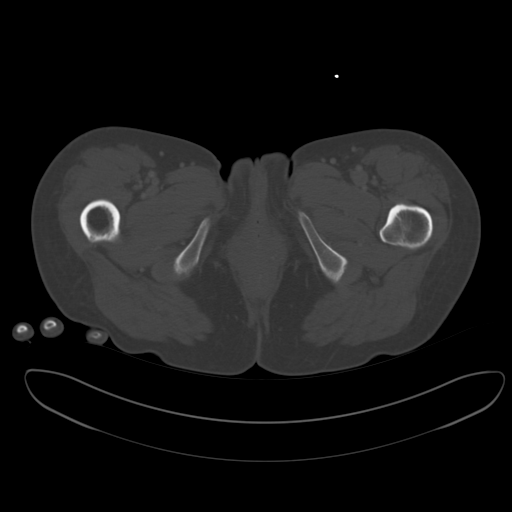
[im 19/144  soft-tissue]
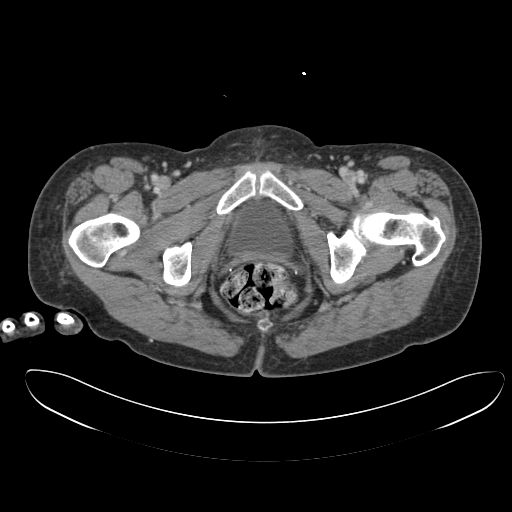
[im 32/144  soft-tissue]
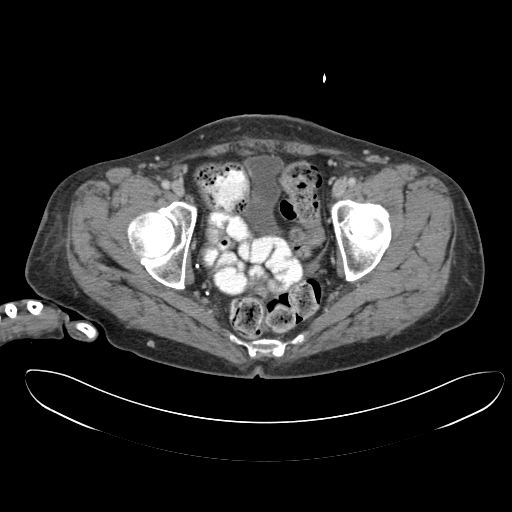
[im 38/144  soft-tissue]
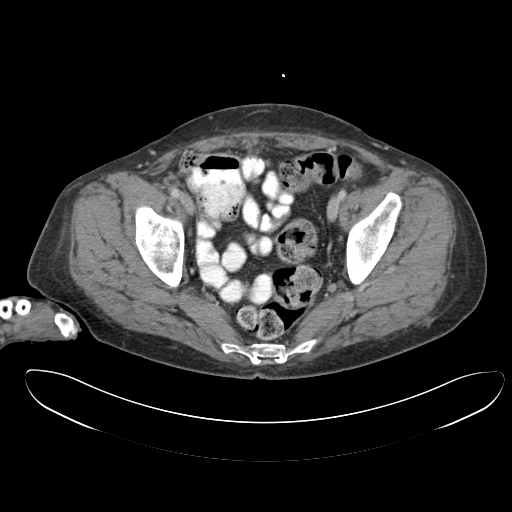
[im 50/144  soft-tissue]
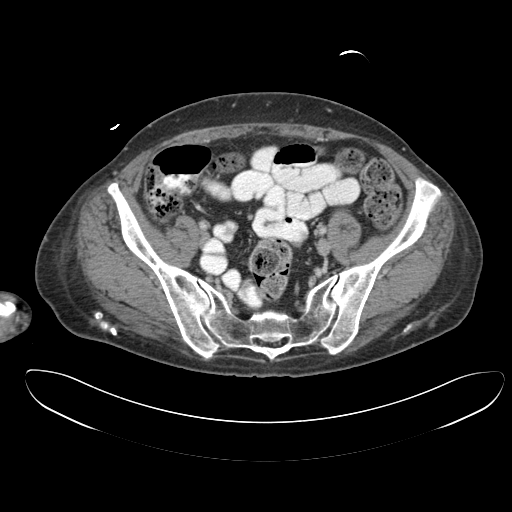
[im 63/144  soft-tissue]
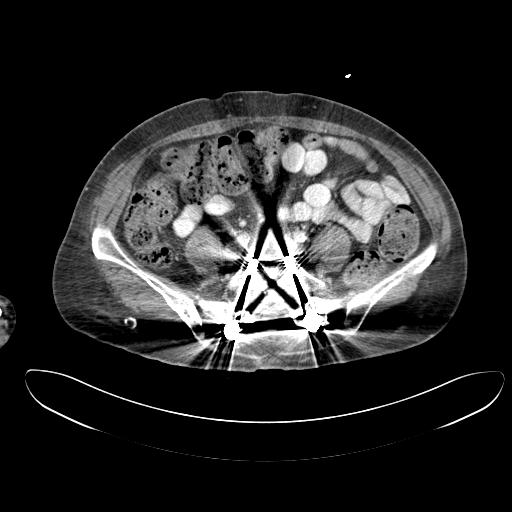
[im 75/144  soft-tissue]
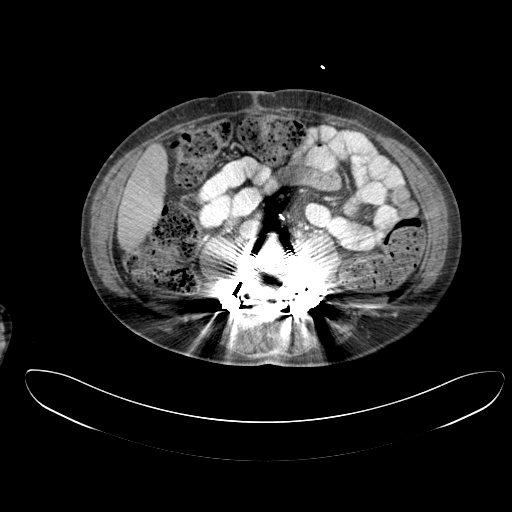
[im 81/144  soft-tissue]
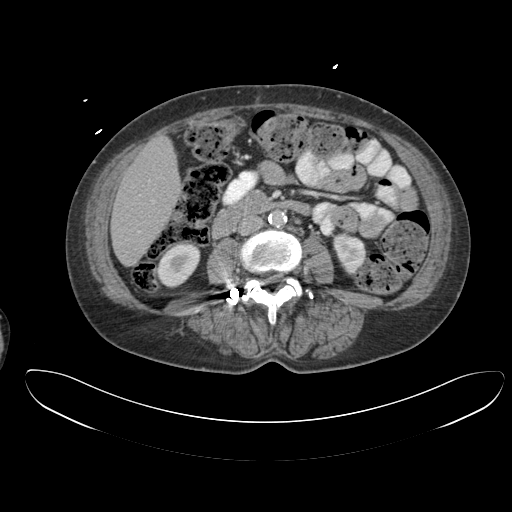
[im 94/144  soft-tissue]
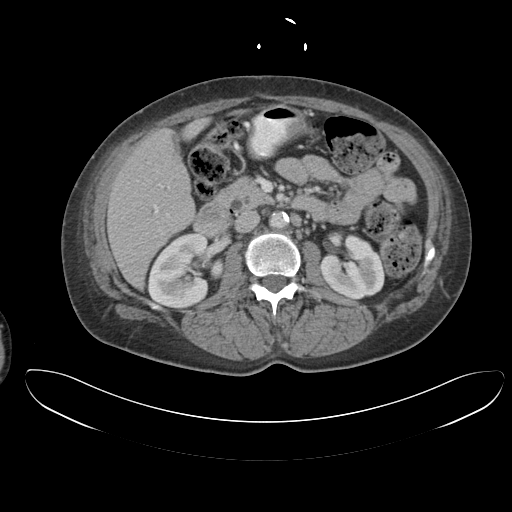
[im 94/144  bone]
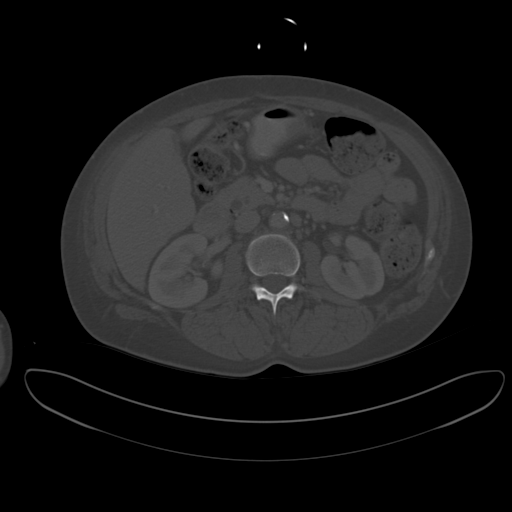
[im 106/144  soft-tissue]
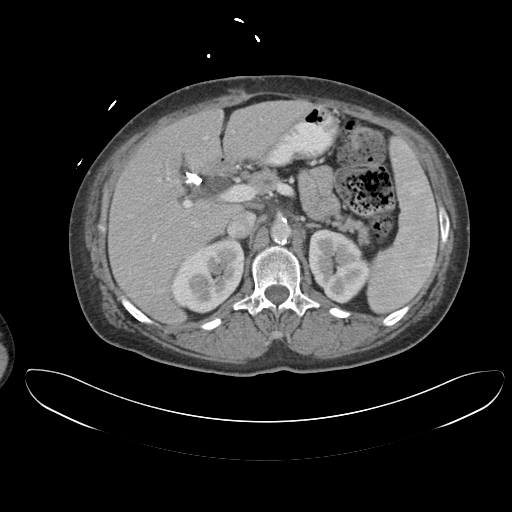
[im 112/144  soft-tissue]
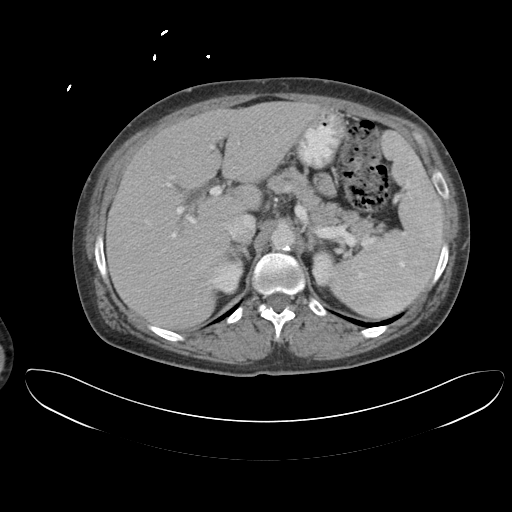
[im 119/144  lung]
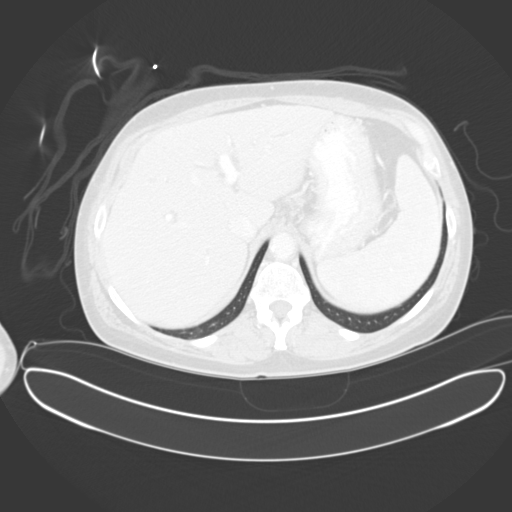
[im 125/144  soft-tissue]
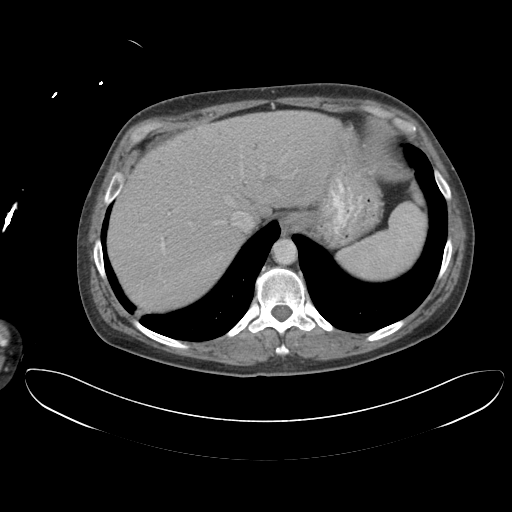
[im 125/144  lung]
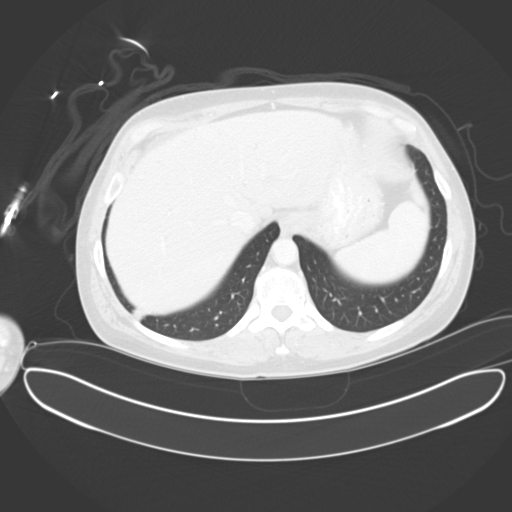
[im 131/144  lung]
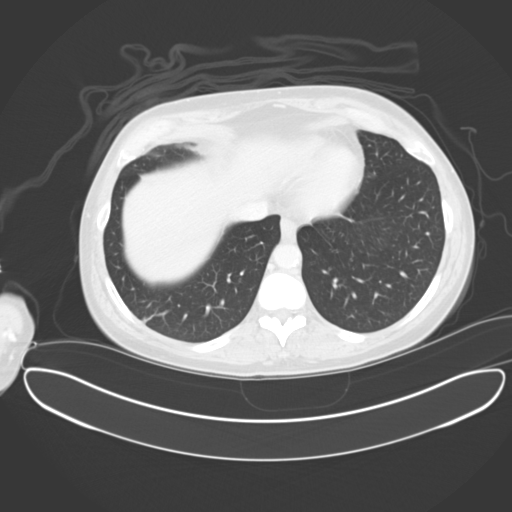
[im 137/144  soft-tissue]
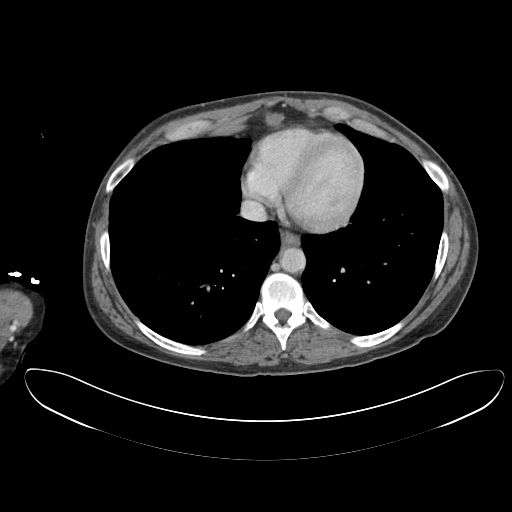
[im 137/144  lung]
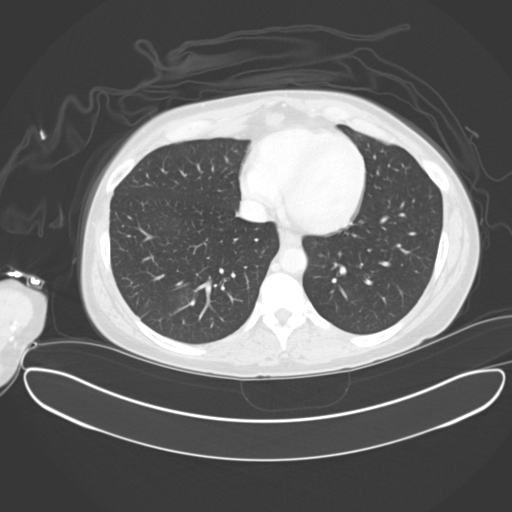

[15 of 32 positions shown; findings below may reference images not displayed]

FINDINGS: There is a right basilar opacity likely representing atelectasis versus
scarring. There is no pneumothorax. The heart size is normal.

The liver demonstrates no focal abnormality. There is no intrahepatic or
extrahepatic biliary ductal dilatation. The gallbladder is surgically
absent. The spleen demonstrates no focal abnormality. The kidneys, adrenal
glands, and pancreas are normal. The bladder is unremarkable.

The stomach, duodenum, small intestine, and large intestine demonstrate no
contrast extravasation or dilatation. There is a large amount stool
throughout the colon.  There is no pneumoperitoneum, pneumatosis, or portal
venous gas. There is no abdominal or pelvic free fluid. There is no
lymphadenopathy.

The abdominal aorta is normal in caliber with atherosclerosis.

There is posterior spinal fusion of the lumbar spine.
IMPRESSION: 1. No acute abdominal or pelvic pathology.

[REDACTED]

## 2014-09-30 NOTE — Discharge Summary (Signed)
PATIENT NAME:  Kathleen Woodard, Kathleen Woodard MR#:  [id number] DATE OF BIRTH:  [birth year]  DATE OF ADMISSION:  11/15/2012 DATE OF DISCHARGE:  12/02/2012  Please take a look at the interim discharge summary done by Dr. Imogene Burnhen on November 20, 2012 which covers hospital course from June 6th until June 13th. Please also look at the interim discharge summary done by Dr. Winona LegatoVaickute which covers hospital course from June 13th onwards until June 21st. This is the interim hospital course from June 21st until the date of discharge, June 25th.   DIAGNOSES AT DISCHARGE:   1.  Right upper quadrant abdominal pain status post laparoscopic cholecystectomy.  2.  Much improved recurrent nausea and vomiting.  3.  Chronic constipation.  4.  Chronic obstructive pulmonary disease.  5.  Gastroesophageal reflux disease.  6.  Chronic back pain.  7.  Hypokalemia and hypomagnesemia, now resolved.  8.  History of bipolar disorder.  9.  History of migraine headaches.   DIET:  The patient was discharged home on a low-residue diet.   ACTIVITY:  As tolerated.   FOLLOWUP:  With Dr. Mechele CollinElliott in the next 1 to 2 weeks. Also follow up with Dr. Bari EdwardLaura Berglund in the next 1 to 2 weeks.   DISCHARGE MEDICATIONS:  Advair 250/50 mcg 1 puff b.i.d., DuoNebs b.i.d. as needed, albuterol inhaler t.i.d. as needed, Dexilant 60 mg daily, omeprazole 20 mg daily, Dilaudid 1 mg q. 6 hours as needed for pain, sucralfate 1 gram q.i.d., Reglan 10 mg q.i.d., promethazine 25 mg q. 6 hours as needed and Zofran 4 mg q. 6 hours as needed.   CONSULTANTS DURING HOSPITAL COURSE:  Dr. Excell Seltzerooper from general surgery and Dr. Mechele CollinElliott from gastroenterology.   INTERIM HOSPITAL COURSE:  This is a 54 year old female with multiple medical problems as mentioned above who presented to the hospital with persistent nausea, vomiting and abdominal pain.  1.  Acute on chronic right upper quadrant abdominal pain. The patient is status post cholecystectomy. She still continued to have some mild  abdominal pain, but showed shows signs of pain-seeking behavior. Initially, she was on IV narcotics. She was eventually switched over to oral narcotics. She was discharged on some oral Dilaudid for pain control which was significantly improved since admission.  2.  Persistent and intractable nausea and vomiting. The exact etiology of this is still unclear, possibly thought to be related to gastroparesis, although she has had a normal gastric emptying study and a normal CAT scan during this hospitalization. She also has had 2 previous CAT scans in February and September of last year which were normal and also a gastric emptying study in January of this year which was normal. As per GI at this point, she does not need a referral to a gastroparesis specialist for further evaluation of her nausea and vomiting. There is some concern that this could be fictitious in nature. The patient while in the hospital had a one-to-one sitter observe her for any further nausea and vomiting. Firefighterost sitter, she had a gastric emptying study done which was essentially normal as mentioned. At this point, she is being discharged on supportive care with oral Reglan, oral Zofran and Phenergan as needed for nausea. While in the hospital, she was extensively maintained on TPN for nutrition, although that was discontinued upon discharge.  3.  Constipation. The patient received multiple stool softeners and enemas while in the hospital. She did have a bowel movement 2 days prior to discharge.  4.  COPD. She had no evidence  of acute COPD exacerbation in the interim hospital course. She was maintained on some nebulizers and oral inhalers. She will continue her DuoNebs and albuterol as stated.  5.  Gastroesophageal reflux disease. The patient was maintained on her Protonix. She is being discharged on that along with her Carafate slurry.   CODE STATUS:  The patient is a full code.   TIME SPENT ON DISCHARGE:  40 minutes.     ____________________________ Rolly Pancake. Cherlynn Kaiser, MD vjs:si D: 12/03/2012 14:53:59 ET T: 12/03/2012 21:13:44 ET JOB#: [id number]  cc: Rolly Pancake. Cherlynn Kaiser, MD, <Dictator> Scot Jun, MD Bari Edward, MD  Houston Siren MD ELECTRONICALLY SIGNED 12/11/2012 15:05

## 2014-09-30 NOTE — Consult Note (Signed)
CC: vomiting, Pt abd not distended, pt without vomiting since sitter has been present except a little bit per the sitter.  Will hold anti-emetics and promotilty meds for the gastric emptying study tomorrow.  also narcotics.  If looks ok can go home afterwards.  Pt was eariler threatening to leave AMA due to not wanting a sitter but husband convinced her to stay.  Discussed with Dr. Nevin BloodgoodSianani.  Electronic Signatures: Scot JunElliott, Anirudh Baiz T (MD)  (Signed on 24-Jun-14 17:28)  Authored  Last Updated: 24-Jun-14 17:28 by Scot JunElliott, Avaneesh Pepitone T (MD)

## 2014-09-30 NOTE — H&P (Signed)
PATIENT NAME:  Kathleen Woodard, Kathleen MR#:  [id number] DATE OF BIRTH:  05/06/1961  DATE OF ADMISSION:  11/13/2012  PRIMARY CARE PHYSICIAN: Bari EdwardLaura Berglund, MD  CHIEF COMPLAINT: Right upper quadrant abdominal pain for 4 days, nausea and vomiting for 2 months.  HISTORY OF PRESENT ILLNESS: Ms. Kathleen Woodard is a 54 year old Caucasian female who has past medical history of chronic respiratory failure secondary to asthma and COPD, on home oxygen, migraine headaches, chronic back pain and history of on-and-off nausea, vomiting and abdominal pain with essentially negative work-up last year in February 2013 except endoscopy showing gastritis. Comes in with acute abdominal pain, right upper quadrant, sharp, going to the back and up to her right shoulder, intermittent for the last 4 days. The patient says she has been not able to keep anything down, has been having nausea, vomiting and "supposedly" lost 35 pounds in the last 2 months. She said she [facility]. However, she does not want to go anywhere else but [facility]. In the Emergency Room, the patient received IV Dilaudid, IV morphine, Phenergan. Ultrasound of the abdomen shows dilated CBD of 10.3 mm. Her ultrasound last year in 2013 showed CBD of 4.9 mm. She is being admitted for further evaluation and management for abdominal pain and dilated CBD.   PAST MEDICAL HISTORY: As listed: 1.  Chronic bronchitis with chronic COPD and on home oxygen.  2.  Chronic respiratory failure, on oxygen.  3.  Chronic back pain.  4.  Bipolar disorder.  5.  Migraine headaches.  6.  Acid reflux/acute gastritis noted on endoscopy done in the past.  7.  Ex-tobacco abuse.  8.  Chronic thrombocytopenia.   PAST SURGICAL HISTORY: 1.  Total hysterectomy.  2.  Appendectomy.   ALLERGIES: CODEINE CAUSES NAUSEA, VOMITING AND DIARRHEA. THE PATIENT IS ABLE TO TOLERATE MORPHINE AND DILAUDID VERY WELL. IMITREX CAUSES ANAPHYLAXIS. ZOFRAN CAUSES HEADACHE.  MEDICATIONS AT  HOME:   1.  Baclofen 10 mg b.i.d.  2.  Advair 250/50, 1 puff b.i.d.  3.  Albuterol/ipratropium 3 mL b.i.d. as needed.  4.  Dexilant 60 mg delayed-release daily.  5.  Metoclopramide 10 mg b.i.d.  6.  Omeprazole 20 mg at bedtime.  7.  Oxygen 2 liters via nasal cannula as needed.  8.  ProAir HFA 90 mcg per inhalation 2 puffs t.i.d.   SOCIAL HISTORY: She was an ex-long-term smoker. No alcohol or drug use.  FAMILY HISTORY: Both parents are deceased. Her mother had lung cancer and also inoperable brain tumor. Father had heart disease, prostate and kidney cancer.   REVIEW OF SYSTEMS:    CONSTITUTIONAL: No fever. Positive for fatigue, weakness and weight loss.  EYES: No blurred or double vision.  EARS, NOSE, THROAT: No tinnitus, ear pain, hearing loss or postnasal drip.  RESPIRATORY: No cough, wheeze, hemoptysis or dyspnea.  CARDIOVASCULAR: No chest pain, orthopnea, edema or arrhythmia.  GASTROINTESTINAL: Positive for nausea, vomiting, abdominal pain and GERD. No melena or diarrhea.  GENITOURINARY: No dysuria or hematuria.  ENDOCRINE: No polyuria, nocturia or thyroid problems.  HEMATOLOGIC: No anemia or easy bruising.  SKIN: No acne or rash.  MUSCULOSKELETAL: Positive for back pain and arthritis.  NEUROLOGIC: No CVA, TIA or focal weakness.  PSYCHIATRIC: Positive for bipolar disorder. All other systems reviewed and negative.   PHYSICAL EXAMINATION: GENERAL: The patient is awake, alert, oriented x 3.  VITAL SIGNS: Afebrile. Pulse is 98. Blood pressure is 120/76. Sats are 97% on room air.  HEENT: Atraumatic, normocephalic.  Pupils: PERRLA. EOM intact. Oral mucosa is moist.  NECK: Supple. No JVD. No carotid bruit.  LUNGS: Clear to auscultation bilaterally. No rales, rhonchi, respiratory distress or labored breathing.  HEART: Both the heart sounds are normal. Rate, rhythm regular. PMI not lateralized. Chest nontender. Good pedal pulses, good femoral pulses. No lower extremity edema.  ABDOMEN:  Soft. The patient winces and screams with pain even while I put 1 finger on her right upper quadrant. She is quite sensitive to it. Good bowel sounds. Again, limited exam secondary to the patient's reaction to my palpation.  EXTREMITIES: Good pedal pulses, good femoral pulses. No lower extremity edema.  NEUROLOGIC: Grossly intact cranial nerves II through XII. No motor or sensory deficit.  PSYCHIATRIC: The patient is awake, alert, oriented x 3, somewhat anxious.   LABORATORY AND RADIOLOGICAL DATA: CBC within normal limits. Comprehensive metabolic panel within normal limits. Lipase 228. Ultrasound of abdomen: No cholelithiasis. Common bile duct is dilated measuring 10.3 cm without choledocholithiasis. Increased echogenicity of the pancreas is seen with pancreatitis. Urinalysis negative for UTI. Gastric emptying study was done on 07/01/2012, showed delayed gastric emptying study. The patient had a HIDA scan last year which was normal in September 2013.   ASSESSMENT: A 54 year old Ms. Kathleen Woodard with history of chronic respiratory failure secondary to asthma, chronic obstructive pulmonary disease, on home oxygen, bipolar disorder and chronic abdominal pain on and off now comes in with:  1.  Acute on chronic right upper quadrant abdominal pain: The patient states she has been having vomiting and nausea for the last 2 months on and off and all of a sudden started having this right upper quadrant abdominal pain which radiates to the back and right shoulder. She will be kept "nothing by mouth" except ice and medications. Intravenous fluids. Oral Phenergan as needed. Intravenous Dilaudid as needed. Will schedule her for MRCP in the morning. Gastroenterology consultation with Dr. Mechele Collin; the case was discussed with him. Further work-up according to gastroenterology recommendations.  2.  Chronic obstructive pulmonary disease/asthma, ex-smoker, on oxygen: Continue nebulizer and oral inhalers.  3.  Acid reflux:  Continue her Prilosec.  4.  Chronic back pain: The patient claims she takes baclofen 10 mg twice daily at home.  5.  Deep vein thrombosis prophylaxis: Will give her heparin subcutaneously 3 times daily.  Further work-up per the patient's clinical course. Hospital admission plan was discussed with the patient. The case was discussed with Dr. Mechele Collin.   TIME SPENT: 50 minutes.    ____________________________ Wylie Hail Allena Katz, MD sap:jm D: 11/13/2012 19:24:00 ET T: 11/13/2012 20:22:26 ET JOB#: [id number]  cc: Hallis Meditz A. Allena Katz, MD, <Dictator> Bari Edward, MD   Willow Ora MD ELECTRONICALLY SIGNED 11/21/2012 14:36

## 2014-09-30 NOTE — Consult Note (Signed)
PATIENT NAME:  Kathleen Woodard, Kathleen Woodard DATE OF BIRTH:  02-06-1961  DATE OF CONSULTATION:    CONSULTING PHYSICIAN:  Scot Junobert T. Elliott, MD  CHIEF COMPLAINT: Right upper quadrant pain, nausea and vomiting of 1-1/2 years' duration, with frequent attacks lasting 1 to 2 weeks.  HISTORY OF PRESENT ILLNESS: The patient is a 54 year old white female who has attacks of chronic abdominal pain coming and lasting for 2 to 2-1/2 weeks, sometimes subsiding. These are in the right upper quadrant with radiation to the right shoulder blade and into the back. When the patient gets one these spells, they are severe, can last for several days, associated with multiple episodes of vomiting. She has had a negative work-up so far.   She has an ultrasound of the abdomen this time showing a dilated common bile duct at 10.3 mm. Last ultrasound in 2013 showed a common bile duct of 4.9 mm. She says she had an ultrasound  10 years ago that did show stones. [facility] at [facility], but at the time they did not feel this warranted gallbladder removal.   In her work-up for this, she has had a negative HIDA scan and she has had upper endoscopy by Dr. Niel HummerIftikhar along with a colonoscopy. Upper endoscopy showed erythematous gastritis.   The patient had 2 vomiting spells when I was in the room talking with her.   PAST MEDICAL HISTORY:  1.  Chronic bronchitis with COPD, on home oxygen, chronic respiratory insufficiency.  2.  Chronic back pain. 3.  Bipolar disorder. 4.  Migraine headaches. 5.  Ex-tobacco abuse.  6.  Chronic thrombocytopenia.   PAST SURGICAL HISTORY: Total hysterectomy and appendectomy.   ALLERGIES: CODEINE, IMITREX. ZOFRAN CAUSES HEADACHES.  MEDICATIONS AT HOME: Baclofen 10 mg b.i.d., Advair 250/50 one puff b.i.d., albuterol/ipratropium 3 mL b.i.d. as needed, Dexilant 60 mg a day, metoclopramide 10 mg b.i.d., omeprazole 20 mg at bedtime, oxygen 2 liters per nasal cannula p.r.n., ProAir HFA 90  mcg per inhalation 2 puffs t.i.d.   HABITS: She is a previous long-term smoker. No alcohol or drug use.   FAMILY HISTORY: Mother had lung cancer. Father had heart disease, prostate and kidney cancer. Her sister, her father and her mother have all had their gallbladders out.   PHYSICAL EXAMINATION: VITAL SIGNS: Temperature 98.3, pulse 108, respirations 20, blood pressure 168/87, O2 sat 95% on room air.  GENERAL: White female, looks ill. She had vomiting during the interview and exam.  HEENT: Sclerae anicteric. Conjunctivae negative. Tongue negative.  NECK: Trachea is in the midline.  CHEST: Clear.  HEART: No murmurs or gallops I can hear.  ABDOMEN: Bowel sounds are present. The abdomen is nondistended. There is right upper quadrant tenderness to palpation. When she flexes her neck and tightens her abdominal muscles, the tenderness is significantly improved.  SKIN: Warm and dry.  PSYCHIATRIC: The patient looks uncomfortable and ill.   LABORATORY AND RADIOLOGICAL DATA: Glucose 83, BUN 6, creatinine 0.54, sodium 144, potassium 3.7, chloride 111, CO2 of 28, calcium 8.8, lipase 228, total protein 7, albumin 3.4, total bilirubin 0.4, alkaline phosphatase 77, SGOT 35, SGPT 25. White count 6.3, hemoglobin 16.2, platelet count 191. Urinalysis: 2+ leukocyte esterase, indicating urinary tract infection. MRCP shows a normal MRCP. Ultrasound of the abdomen shows a dilated common bile duct, increased echogenicity of the pancreas.   ASSESSMENT: The patient has recurrent attacks of abdominal pain that last up to 2 to 2-1/[redacted] weeks along with weight loss. Pain is  in the right upper quadrant going into the back. She has a family history of gallbladder removal in her sister, her father and her mother. The patient's tenderness is diminished by flexing her neck. These all point to significant sources of problems probably with her gallbladder and biliary system. I agree with Dr. Hoy Finlay approach of gallbladder removal  and if she has sphincter of Oddi dysfunction after that, then an ERCP and sphincterotomy may be of benefit. It is interesting that she has a slightly plump pancreas and inflammation. It is possible that she is forming small pancreatic stones and giving her attacks of pain, although one would normally expect to see a lipase elevated with that. Her lipase is not elevated at this time, but that is a thought for consideration.   RECOMMENDATIONS: Proceed with empiric gallbladder removal based on her history of stones in the past, recurrent attacks of right upper quadrant abdominal pain, a currently dilated common bile duct, with the understanding that 100% certainty that this will help her cannot be offered at this time and if she has severe sphincter of Oddi dysfunction, further treatment with ERCP and sphincterotomy may be recommended. Since she had an upper endoscopy within the last 9 months, I do not see an indication for repeating it at this time.    ____________________________ Scot Jun, MD rte:jm D: 11/14/2012 16:11:00 ET T: 11/14/2012 17:49:35 ET JOB#: [id number]  cc: Scot Jun, MD, <Dictator> Bari Edward, MD Claude Manges, MD Scot Jun MD ELECTRONICALLY SIGNED 11/18/2012 7:18

## 2014-09-30 NOTE — Op Note (Signed)
PATIENT NAME:  Kathleen Woodard, Kathleen Woodard DATE OF BIRTH:  Nov 11, 1960  DATE OF PROCEDURE:  11/18/2012  PREOPERATIVE DIAGNOSIS: Chronic cholecystitis.   POSTOPERATIVE DIAGNOSIS: Chronic cholecystitis.   PROCEDURES:  1. Diagnostic laparoscopy.  2. Laparoscopic cholecystectomy.  3. Peritoneal biopsies.   SURGEON: Haly Feher E. Excell Seltzerooper, M.D.   ASSISTANT: Trellis MomentNatalie Kretzer, PA-S.   INDICATIONS: This is a patient with recurrent epigastric and right upper quadrant pain with nausea and a workup showing normal gallbladder without stones and a normal HIDA scan and a normal EGD; however, this has been going on for so long and with the classic symptoms related to likely gallbladder, it has been decided to proceed with laparoscopic cholecystectomy with the understanding, that was discussed multiple times, that this may not resolve all of the patient's symptoms. We also discussed the risks of bleeding, infection, recurrent symptoms, open procedure, bile duct damage, bile duct leak, retained common bile duct stone, any of which could require further surgery and/or ERCP, stent and papillotomy. This was all reviewed for her in the preop holding area. She understood and agreed to proceed.   FINDINGS: Thickened scarified gallbladder with scar in the gallbladder fossa and scar of omentum to the anterior surface of the gallbladder.   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 Photos were taken, and peritoneal biopsies were obtained utilizing a cold biopsy forceps from multiple areas. These were predominantly on the right side and in the right diaphragm area. None were noticed on the left. They appeared to be much like endometriosis.   DESCRIPTION OF PROCEDURE: The patient was induced with general anesthesia. She was given IV antibiotics. VTE prophylaxis was in place. She was prepped and draped in a sterile fashion. Marcaine was infiltrated in the skin and subcutaneous tissues around the  periumbilical area. Incision was made. Veress needle was placed. Pneumoperitoneum was obtained, and a 5 mm trocar port was placed. The abdominal cavity was explored, and under direct vision, a 10 mm epigastric port and 2 lateral 5 mm ports were placed. The peritoneal implants were identified, and a cold forceps was utilized to biopsy these areas. Multiple biopsies were taken, and they were sent off for examination in formalin.   The gallbladder was placed on tension. It was found to be somewhat scarified and not normal in appearance. The adhesions to the omentum were taken down sharply without the use of energy. Then, the peritoneum over the infundibulum was incised bluntly and sharply without the use of energy. The cystic duct and gallbladder junction was dissected out and identified well. The cystic artery was doubly clipped and divided in 2 branches, and then the cystic duct as it entered the infundibulum was doubly clipped and divided. The gallbladder was taken from the gallbladder fossa with electrocautery from a scarified fossa. It was sent off for examination after retrieval utilizing an Endo Catch bag through the epigastric port site.   The port site was replaced. The area was irrigated with copious amounts of normal saline. There was no sign of bleeding from the biopsy sites nor from the gallbladder fossa. There was no sign of bowel injury or bile leak. Placement of the camera into the epigastric site to view back at the periumbilical site. There were adhesions well away from the periumbilical site in the pelvis, likely from prior surgery.    At this point, the pneumoperitoneum was released. All ports removed. Fascial edges were approximated with 0 Vicryl figure-of-eight sutures, and 4-0 subcuticular Monocryl was used on all skin edges.  Steri-Strips, Mastisol and sterile dressings were placed.   The patient tolerated the procedure well. There were no complications. She was taken to the recovery room  in stable condition to be admitted for continued care. Sponge, lap and needle counts were correct.   ____________________________ Adah Salvage. Excell Seltzer, MD rec:gb D: 11/18/2012 16:17:36 ET T: 11/18/2012 21:53:07 ET JOB#: [id number]  cc: Adah Salvage. Excell Seltzer, MD, <Dictator> Lattie Haw MD ELECTRONICALLY SIGNED 11/21/2012 11:22

## 2014-09-30 NOTE — Consult Note (Signed)
CC: RUQ abd pain, vomiting.  Pt running out of veins and needs PIC line.  Surgeon to see today.  I plan to do EGD tomorrow morning to be sure no gastric or duodenal problems before possible surgery.  Pt in agreement.  Her 56 year old niece is getting her gall bladder out today.  3 first degree relatives had GB out.  May have SOD also.  Electronic Signatures: Scot JunElliott, Robert T (MD)  (Signed on 09-Jun-14 07:50)  Authored  Last Updated: 09-Jun-14 07:50 by Scot JunElliott, Robert T (MD)

## 2014-09-30 NOTE — Consult Note (Signed)
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   Patient was seen by consultant.   Orders entered.   Comments: 40 lb wt loss over 2 1/2 mos.  nl U/S (w/ 5 mm CBD), CT, GBEF (62%) Feb 2013 nl U/S (w/ 5 mm CBD), GBEF (80%) Sept 2013 nl U/S (w/ 10 mm CBD), LFTs this admission Despite these normal studies her Sx are classic for either biliary dyskinesia or Sphincter of Oddi spasm. Since lap ccy has a lower risk than ERCP/Sphincterotomy, lap ccy should be tried first. Will await MRCP. In the abscence of stones, this is not a surgical emergency. I will have Dr Excell Seltzerooper evaluate her Monday and he will make an independant decision re: surgery. In the meantime I have adjusted her meds for nausea.  Electronic Signatures: Claude MangesMarterre, Izzabelle Bouley F (MD)  (Signed 07-Jun-14 12:25)  Authored: Brief Consult Note   Last Updated: 07-Jun-14 12:25 by Claude MangesMarterre, Maddelyn Rocca F (MD)

## 2014-09-30 NOTE — Consult Note (Signed)
Pt CC vomiting.  Pt with less vomiting today, abd not distended, not esp tender.  Advised her to drink things that will not inflame her stomach.  She is asking when she can go home, feel like in next 1-2 days but that is up to Hospitalist.  Electronic Signatures: Scot JunElliott, Robert T (MD)  (Signed on 19-Jun-14 19:32)  Authored  Last Updated: 19-Jun-14 19:32 by Scot JunElliott, Robert T (MD)

## 2014-09-30 NOTE — Discharge Summary (Signed)
PATIENT NAME:  Kathleen IslamDICKERSON, Jatoya MR#:  [id number] DATE OF BIRTH:  April 08, 1961  DATE OF ADMISSION:  11/15/2012  DATE OF DISCHARGE:  11/21/2012  PRIMARY CARE PHYSICIAN:  Dr. Dawson BillsBertlund  DISCHARGE DIAGNOSES: 1.  Abdominal pain, nausea, vomiting. 2.  Chronic cholecystitis.  3.  Esophagitis.  4.  COPD, on home oxygen.   PROCEDURE:  Diagnostic laparoscopy with cholecystectomy, peritoneal biopsy.   CONDITION:  Stable.   CODE STATUS:  FULL CODE.   MEDICATIONS: [facility].   DIET:  Regular diet.   ACTIVITY: As tolerated.   FOLLOW UP CARE:  Follow with PCP within 1 to 2 weeks. Follow up with Dr. Mechele CollinElliott and  Dr. Excell Seltzerooper within one week.   REASON FOR ADMISSION: Right upper quadrant abdominal pain for 4 days, with nausea and vomiting for 2 months.   HOSPITAL COURSE: The patient is a 54 year old Caucasian female with a history of chronic respiratory failure, COPD, on home oxygen, chronic back pain, has nausea/vomiting for 2 months and weight loss, 5 pounds, for the last 2 months. The patient was treated with Dilaudid, morphine, Phenergan. In the ED, abdominal ultrasound showed dilated SGPT of 10.3 mm.  Ultrasound in the last year showed CBD was 4.9. The patient was admitted for abdominal pain, dilated CBD. For detailed history and physical examination, please refer to the admission note dictated by Dr. Enedina FinnerSona Patel. On admission date, the patient's CBC was in normal range. CMP was in normal range, lipase 228. Urinalysis was negative. After admission, patient had been treated with n.p.o. with IV fluid support, Zofran, Phenergan p.r.n. Patient got an MRCP, which was negative. Dr. Mechele CollinElliott evaluated the patient and suggested endoscopy, which showed eosinophilic esophagitis. Dr. Excell Seltzerooper evaluated the patient and suggested that patient has severe abdominal pain with nausea/vomiting. The abdominal pain is in the right upper quadrant. The patient will  possibly need a cholecystectomy. The patient underwent laparoscopic cholecystectomy yesterday, which showed chronic cholecystitis. After surgery, the patient's abdominal pain is much improved, but still has nausea, vomiting. Dr. Excell Seltzerooper suggested treat nausea, vomiting with Phenergan p.r.n., and possible discharge to home tomorrow.   For COPD and chronic respiratory failure, patient has been treated with oxygen and nebulizer.   Patient is currently stable, but still has nausea and vomiting. She will possibly be discharged to home tomorrow.   I discussed patient's discharge plan with patient and Dr. Excell Seltzerooper.   TIME SPENT:  About 43 minutes.    ____________________________ Shaune PollackQing Karianna Gusman, MD qc:mr D: 11/20/2012 14:09:00 ET T: 11/20/2012 21:54:10 ET JOB#: [id number]  cc: Shaune PollackQing [place] Seaborn, MD, <Dictator> Shaune PollackQING Destine Zirkle MD ELECTRONICALLY SIGNED 11/23/2012 17:33

## 2014-09-30 NOTE — Consult Note (Signed)
Pt CC vomiting.  She has a CT scan that shows stool through out the colon.  This would not be possible if she vomits when she tries to eat.  Requested a full time sitter to observe her activity and then gastric empty study tomorrow.  Electronic Signatures: Scot JunElliott, Janitza Revuelta T (MD)  (Signed on 24-Jun-14 07:04)  Authored  Last Updated: 24-Jun-14 07:04 by Scot JunElliott, [place]ale Wherry T (MD)

## 2014-09-30 NOTE — Consult Note (Signed)
CC: vomiting. Pt reports no spells of vomiting today, pain is less.  Abd film from yesterday showed a lot of stool in desc, sig and rectum.  Rectal exam today shows formed firm stool in rectal vault.  Will give Fleets enema now and dulcolax pills.  Recheck rectal tomorrow.  Told pt pain medicine may contribute to constipation and need to start tapering off of it.  Will leave actual dosage reductions to Hospitalist and surgeon.  Continue fixed round the clock anti nausea meds.  Electronic Signatures: Scot JunElliott, Thamas Appleyard T (MD)  (Signed on 17-Jun-14 17:25)  Authored  Last Updated: 17-Jun-14 17:25 by Scot JunElliott, Bakary Bramer T (MD)

## 2014-09-30 NOTE — Consult Note (Signed)
I will sign off case.  If any biliary problems arise Dr. Bluford Kaufmannh or Dr. Servando SnareWohl will be available.  Electronic Signatures: Scot JunElliott, Ravyn Nikkel T (MD)  (Signed on 11-Jun-14 06:48)  Authored  Last Updated: 11-Jun-14 06:48 by Scot JunElliott, Cornelio Parkerson T (MD)

## 2014-09-30 NOTE — Consult Note (Signed)
CC: post op nausea and vomiting.  Discussed with patient who is concerned that any changes might cut back on her pain medicine.  Will change zofran to fixed dose, start small dose of ativan, change PPI to iv, continue reglan and phenergan as dosed.  Expect some improvement in 24-48 hours.  Path showed chronic cholecystitis.  Electronic Signatures: Scot JunElliott, Marie Chow T (MD)  (Signed on 16-Jun-14 17:51)  Authored  Last Updated: 16-Jun-14 17:51 by Scot JunElliott, Nela Bascom T (MD)

## 2014-09-30 NOTE — Consult Note (Signed)
CC: vomiting.  Pt down in CT at time of my visit, I spoke to her family about plan for gastric empty study for tomorrow or Wednesday and [facility]  Electronic Signatures: Scot JunElliott, Alena Blankenbeckler T (MD)  (Signed on 23-Jun-14 17:19)  Authored  Last Updated: 23-Jun-14 17:19 by Scot JunElliott, Natsha Guidry T (MD)

## 2014-09-30 NOTE — Consult Note (Signed)
CC: abd pain.  Pt with contd pain, some vomiting, medicated for this.  Pulse 69-105, BP 152./ 90,  sat 92-100 on room air, Chest with end exp wheezes. Abd still very tender to palpation even with stethoscope.  Await surgical opinion. No new suggestions from me.  Electronic Signatures: Scot JunElliott, Robert T (MD)  (Signed on 08-Jun-14 09:59)  Authored  Last Updated: 08-Jun-14 09:59 by Scot JunElliott, Robert T (MD)

## 2014-09-30 NOTE — Consult Note (Signed)
Pt CC is post op vomiting.  She has wretching but very little produced.  Pt drinking soft drinks which can be irritating given the low pH of soda pop. Would stop those.  She should begun tapering off some of her Dilaudid by 20-25% per day by decreasing the amount  of each dose.  Pt had good response to the constipation meds, x-ray today shows no stool accumulation.   Electronic Signatures: Scot JunElliott, Brixton Franko T (MD)  (Signed on 18-Jun-14 15:55)  Authored  Last Updated: 18-Jun-14 15:55 by Scot JunElliott, Alexianna Nachreiner T (MD)

## 2014-09-30 NOTE — Consult Note (Signed)
CC: vomiting.  Pt discussed with Hospitalist.  She is showing behavior suggestive of narcotic dependence.  Recommend taper off iv dilaudid if possible.  May try carafate slurry for her gastritis.  Dr. Marva PandaSkulskie will be on this weekend but will not see her unless you call about a problem.  Electronic Signatures: Scot JunElliott, [place] Karl T (MD)  (Signed on 20-Jun-14 17:52)  Authored  Last Updated: 20-Jun-14 17:52 by Scot JunElliott, Asmar Brozek T (MD)

## 2014-10-02 NOTE — Consult Note (Signed)
I will be off this weekend, Dr. Bluford Kaufmannh will cover me but will not see this patient unless you call about a problem  Electronic Signatures: Scot JunElliott, Josanna Hefel T (MD)  (Signed on 22-Feb-13 17:10)  Authored  Last Updated: 22-Feb-13 17:10 by Scot JunElliott, Shelda Truby T (MD)

## 2014-10-02 NOTE — H&P (Signed)
PATIENT NAME:  Kathleen, Woodard MR#:  [id number] DATE OF BIRTH:  1960-12-21  DATE OF ADMISSION:  09/17/2011  PRIMARY CARE PHYSICIAN: Bari Edward, MD   GI PHYSICIAN: Lynnae Prude, MD    CHIEF COMPLAINT: Abdominal pain, nausea, vomiting, and now rectal bleeding.   HISTORY OF PRESENT ILLNESS: The patient is a 54 year old female with a known history of gastritis, chronic obstructive pulmonary disease, is being admitted for anemia, rectal bleed, nausea, vomiting, and abdominal pain. The patient was recently in the hospital from the 21st of February until the 23rd of February when she had endoscopy and was found to have gastritis. She had had nausea, vomiting, and abdominal pain at the time. Since then, she has been doing okay but started having worsening abdominal pain recently in the last few days associated with nausea and vomiting. She has been having trouble keeping anything oral down except some crackers. She claims a 30-pound weight loss in last few weeks, although she could not specify a timeframe. She states she has been vomiting constantly yesterday and threw up blood also yesterday.  She woke up around 4:00 in the morning when she had rectal bleeding a couple of times, and anything she eats or drinks she cannot keep it down. She was seen in the Emergency Room last Sunday for the same presentation. She is being admitted for further evaluation and management. She claims her pain is cramping and shooting pain localized in the epigastric region. She denies any loose stool.   PAST MEDICAL HISTORY:  1. Chronic obstructive pulmonary disease.  2. Gastroesophageal reflux disease.  3. Gastritis. 4. Migraine.  5. Back pain.   PAST SURGICAL HISTORY:  1. Sinus surgery.   2. Vocal cord surgery.  3. Back surgery.   ALLERGIES: Ciprofloxacin, Imitrex and codeine.   HOME MEDICATIONS:  1. Acetaminophen/hydrocodone 325/5, 1 tablet p.o. every 6 hours as needed.  2. Advair 250/50, 1 puff b.i.d.   3. DuoNebs every 12 hours as needed.  4. Carafate 1 gram p.o. t.i.d.  5. Norco 325/5, 1 tablet p.o. every 6 hours as needed.  6. Omeprazole 20 mg p.o. daily.  7. Oxygen 2 liters via nasal cannula as needed.  8. Phenadoz 25 mg suppository rectally every 6 hours as needed.  9. ProAir 2 puffs inhaled b.i.d.   10. Promethazine 25 mg p.o. every 6 hours as needed.  11. Reglan 10 mg p.o. t.i.d. as needed.   SOCIAL HISTORY: She is a long-time former smoker, quit about 2 to 4 months ago. No alcohol or drug use.   FAMILY HISTORY: Both parents are deceased. Mother had lung cancer, also had a brain tumor which was inoperable. Father had heart disease along with prostate and kidney cancer.   REVIEW OF SYSTEMS: CONSTITUTIONAL: No fever, fatigue, weakness. EYES: No blurred or double vision. ENT: No tinnitus or ear pain. RESPIRATORY: No cough, wheezing, hemoptysis. CARDIOVASCULAR: No chest pain, orthopnea, or edema. GASTROINTESTINAL: Positive for nausea, vomiting, some rectal bleeding and abdominal pain. GENITOURINARY: No dysuria or hematuria. ENDOCRINE: No polyuria or nocturia. HEMATOLOGY: Positive for anemia with a hemoglobin of 10. No easy bruising. Positive for rectal bleed. ENDOCRINE: No thyromegaly or heat or cold intolerance. NEUROLOGIC: No tingling, numbness, or weakness. PSYCHIATRY: Questionable history of bipolar disorder. No anxiety or depression.   PHYSICAL EXAMINATION:  VITAL SIGNS: Temperature 98.2, heart rate 71 per minute, respirations 20 per minute, blood pressure 147/99 mmHg. She is saturating 98% on room air.   GENERAL: The patient is a 54 year old female lying  in the bed comfortably without any acute distress.   HEENT: Eyes: Pupils are equal, round and reactive to light and accommodation. No scleral icterus. Extraocular muscles are intact. HENT: Head atraumatic, normocephalic. Oropharynx and nasopharynx are clear.   NECK: Supple. No jugular venous distention. No thyroid enlargement or  tenderness.   LUNGS: Clear to auscultation bilaterally. No wheezes, rales, rhonchi, or crepitation.   ABDOMEN: Soft, generalized tenderness, seems more subjective as she was fine when I entered the room and I diverted her attention. She does not seem to have any facial grimacing, but at times when she notices she is grimacing. No organomegaly appreciated. Scarring in the infraumbilical area.  NEUROLOGICAL: Cranial nerves II through XII are intact. Muscle strength is 5 out of 5 in all  extremities. Sensation intact.   PSYCHIATRIC: The patient is oriented to time, place, and person x3.   SKIN: No obvious rash, lesion, or ulcer.    LABORATORY, DIAGNOSTIC AND RADIOLOGICAL DATA:  Normal BMP except potassium of 3.3.  Normal liver function tests except AST of 44.  Normal CBC except hemoglobin of 10.9, hematocrit 31.4, platelets 141, with a previous hemoglobin of 13.5 on April 1st.  Normal coagulation panel.  Urinalysis is negative.   IMPRESSION AND PLAN:  1. Abdominal pain, nausea, vomiting: Could be due to underlying gastritis that she has had. We will start her on IV Protonix twice a day. We will put her on a clear liquid diet and monitor. We will obtain GI consult.  2. Anemia: Could be due to rectal bleed. She had an endoscopy on the last admission. We will obtain GI consultation, and start her on Protonix twice a day, and monitor her hemoglobin and hematocrit as she may need colonoscopy. This could be bleeding from her previous gastritis also.  3. Rectal bleeding: Again, this could be upper or lower in nature.  Per Emergency Department physician Hemoccult stool was negative, although the patient claimed  that she had a rectal bleed earlier in the morning. We will monitor her hemoglobin and hematocrit and watch for any further rectal bleed.  4. Hypokalemia: We will replete and recheck, keep her on clear liquid diet for now.   TIME TAKEN: Total time taking care of this patient was 45 minutes.     ____________________________ Ellamae SiaVipul S. Sherryll BurgerShah, MD vss:cbb D: 09/17/2011 16:22:33 ET T: 09/17/2011 16:54:42 ET JOB#: [id number]  cc: Cristie Mckinney S. Sherryll BurgerShah, MD, <Dictator> Bari EdwardLaura Berglund, MD Scot Junobert T. Elliott, MD Ellamae SiaVIPUL S [facility] MD ELECTRONICALLY SIGNED 09/18/2011 10:10
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
## 2014-10-02 NOTE — Consult Note (Signed)
PATIENT NAME:  Kathleen Woodard, Kathleen Woodard DATE OF BIRTH:  June 09, 1961  DATE OF CONSULTATION:  08/01/2011  REFERRING PHYSICIAN:   CONSULTING PHYSICIAN:  Adah Salvageichard E. Excell Seltzerooper, MD  CHIEF COMPLAINT: Epigastric pain.   HISTORY OF PRESENT ILLNESS: This is a patient who states that she has had six weeks of abdominal pain started before Christmas and it is associated with any kind of foods, now it is happening essentially every time she eats she vomits shortly after or during meals and her pain starts in the epigastrium and slightly right upper quadrant, radiates through the right upper quadrant and to her right shoulder. She is having episodes of vomiting essentially every time she eats now. She denies jaundice or acholic stools. No fevers or chills.   Work-up has shown no sign of gallstones and a normal CT scan.   PAST SURGICAL HISTORY:  1. Sinus surgeries multiple. 2. Vocal cord surgeries. 3. Back surgeries with rodding.  4. Appendectomy. 5. Hysterectomy, bilateral salpingo-oophorectomy following the hysterectomy on two separate occasions. 6. She has also had some sort of the adhesiolysis probably in the pelvis   SOCIAL HISTORY: Patient is retired. 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 She stopped smoking two months ago. Does not drink any alcohol.   REVIEW OF SYSTEMS: 10 system review has been performed and negative with the exception of that mentioned in the history of present illness.   ALLERGIES: Codeine and Imitrex.   FAMILY HISTORY: Noncontributory.   PHYSICAL EXAMINATION:  GENERAL: Healthy, uncomfortable-appearing Caucasian female patient.   VITAL SIGNS: Vital signs are stable. She is afebrile.   HEENT: No scleral icterus.   NECK: No palpable neck nodes.   CHEST: Clear to auscultation.   CARDIAC: Regular rate and rhythm.   ABDOMEN: Soft. There is tenderness in the epigastrium without a Murphy's sign. Multiple scars are noted on the  abdomen.  EXTREMITIES: Without edema. Calves are nontender.   NEUROLOGIC: Grossly intact.    INTEGUMENT: No jaundice.   LABORATORY, DIAGNOSTIC AND RADIOLOGICAL DATA: Laboratory values are essentially normal and ultrasound is normal as is the CT scan.   ASSESSMENT AND PLAN: I agree with Dr. Earnest ConroyElliott's plan for obtaining a HIDA scan and an EGD. I suspect that because of this patient's history this is likely related to biliary colic and will likely show an abnormal HIDA scan. If that is the case she will require laparoscopic cholecystectomy. I discussed this with her briefly but will await further testing and follow with the primary team.  ____________________________ Adah Salvageichard E. Excell Seltzerooper, MD rec:cms D: 08/01/2011 21:13:59 ET T: 08/02/2011 07:27:20 ET JOB#: [id number]  cc: Adah Salvageichard E. Excell Seltzerooper, MD, <Dictator> Lattie HawICHARD E Gatlin Kittell MD ELECTRONICALLY SIGNED 08/09/2011 11:31

## 2014-10-02 NOTE — Consult Note (Signed)
Brief Consult Note: Diagnosis: Nausea, vomiting  and abdominal pain.   Patient was seen by consultant.   Comments: Kathleen Woodard with recurrent nausea, vomiting and abdominal pain. Recent EGD showed gastritis. Patient also giving h/o throwing up some blood after vomiting several time. Also h/o rectal bleeding. Hemeoccult negarive. She is mildly anemic. Patient appears to ne under lots of stress and cries frequently.  Recommendations: Continue PPI and control of pain, nausea and vomiting. Will review previous wokup and make further recommendations. Agree that she may benefit from a colonoscopy but she will not be able to tolerate prep at this time. Will follow.  Electronic Signatures: Lurline DelIftikhar, Allee Busk (MD)  (Signed 09-Apr-13 23:01)  Authored: Brief Consult Note   Last Updated: 09-Apr-13 23:01 by Lurline DelIftikhar, Eleah Lahaie (MD)

## 2014-10-02 NOTE — H&P (Signed)
PATIENT NAME:  Kathleen Woodard, KRUSCHKE MR#:  [id number] DATE OF BIRTH:  1961/05/21  DATE OF ADMISSION:  08/01/2011  PRIMARY CARE PHYSICIAN: Bari Edward, MD  CHIEF COMPLAINT: Abdominal pain, nausea and vomiting.   HISTORY OF PRESENT ILLNESS: This is a 54 year old female who has history of chronic obstructive pulmonary disease, she has history of gastroesophageal reflux disease on PPI,  chronic back pain, and history of migraines. Today she comes in with abdominal pain, nausea, and vomiting that has been going on for a month, but has worsened over the last three days. She says the symptoms of abdominal pain started about a month ago and this was off and on. She mainly describes it in the epigastric area. It is sharp in nature, also associated with nausea and vomiting. She says not a day goes by that she does not vomit.  She has been vomiting solid food and fluids as well, but for the last three days the abdominal pain has become severe. She says the skin in the epigastric area is sore to touch and then it radiates down the right side, in the subcostal area, and up to the right shoulder area also. She has never had this kind of pain in the past. She has consistently been vomiting, she says about 15 times a day. She feels dehydrated, although the lab work does not show dehydration. Pain is not precipitated by food. Sometimes the nausea gets better when she eats crackers. She denies any diarrhea, any change in her bowel habits. She denies any dysuria, frequency, or urinary complaints. She says sometimes she has a low grade fever at night, but no documented fever. No chest pain or shortness of breath. She has never had gallbladder problems in the past. Initial work-up in the emergency room was essentially negative with normal a white count, normal LFTs, normal electrolytes, normal lipase and a negative CT scan of the abdomen and an ultrasound of the abdomen. So the hospitalist was asked to admit the patient because of  this intractable nausea, vomiting, and abdominal pain. The patient says the Phenergan they gave her in the emergency room helped her the most. She got about 8 mg of IV morphine in the emergency room, 8 mg of IV Zofran, 25 mg of IV Phenergan, and about 2 mg of IV Dilaudid. The patient denies using NSAIDs or any other pain medications. She says she had an endoscopy done many years ago, about 20 years ago, which showed a small ulcer.   REVIEW OF SYSTEMS: She denies any fever or weakness. No acute change in vision. No headache. No dizziness. No cough. No dyspnea. She has chronic obstructive pulmonary disease. No chest pain. She is complaining of nausea, vomiting, and abdominal pain, but no hematemesis or melena. No dysuria. No frequency. No thyroid problems.  No anemia. No rash. No joint pains. No swelling. She has some chronic back pain. No focal numbness or weakness. No anxiety or depression.   PAST MEDICAL HISTORY:  1. Chronic obstructive pulmonary disease. She was last admitted here in September 2012 because of chronic obstructive pulmonary disease exacerbation and acute bronchitis.  2. Gastroesophageal reflux disease. 3. History of migraines. 4. Chronic back pain. 5. On previous chart it says bipolar disorder, but the patient refutes that.    PAST SURGICAL HISTORY:  1. Sinus surgery. 2. Vocal cord surgery.  3. Back surgery.   OXYGEN: She uses oxygen at night and on a p.r.n. basis.   ALLERGIES TO MEDICATIONS: Ciprofloxacin, codeine, and Imitrex.  HOME MEDICATIONS:  1. Advair Diskus 250/50 one puff twice a day. 2. Albuterol metered-dose inhaler p.r.n. 3. Albuterol nebulizers twice a day.  4. Chantix 1 mg twice a day right now. 5. Tetrazine 10 mg daily. 6. Prevacid 30 mg daily.   SOCIAL HISTORY: She is a long-term smoker. She quit about two months ago. No alcohol or drug use.   FAMILY HISTORY: Both parents are deceased. Her mother had lung cancer and also had inoperable brain tumor.  Father had heart disease, prostate and kidney cancer.   PHYSICAL EXAMINATION:   VITAL SIGNS: When she presented to the emergency room, her temperature was 98.1, heart rate 105, respiratory rate 20, blood pressure 140/104, and saturating 98% on room air.   GENERAL: This is a middle-aged Caucasian female. She is in mild distress because of abdominal pain. She is retching and nauseated while I am interviewing her.   HEENT: Bilateral pupils are equal and reactive. Extraocular muscles are intact. No scleral icterus. No conjunctivitis. Oral mucosa appears to be dry.   NECK: No thyroid tenderness, enlargement, or nodules. Neck is supple. No masses and nontender. No adenopathy. No JVD. No carotid bruit.   LUNGS: Mild coarse breath sounds, prolonged expiratory phase, normal respiratory effort. Not using accessory muscles for respiration.   HEART: Heart sounds are regular. No murmur. Good peripheral pulses. No lower extremity edema.   ABDOMEN: There is some epigastric tenderness. There is some right upper quadrant tenderness also, which is mild. No guarding. No rigidity. No rebound. Normal bowel sounds. No hepatosplenomegaly appreciable. No bruit. No masses. She has a scar in the infraumbilical area.  RECTAL: Deferred.   PSYCH: She is awake, alert, and oriented to time, place, and person.   NEURO: Cranial nerves are intact, moving all extremities against gravity.   EXTREMITIES: No cyanosis. No clubbing.   SKIN: No rash. No lesions.   LABS/STUDIES: White count 5.3, hemoglobin 14, platelet count 135,000. She has some chronic thrombocytopenia, mild. Her platelet count was 129,000 in September 2012. BMP: Sodium 140, potassium 3.5, BUN 5, creatinine 0.75. LFTs are normal. Lipase 171.   Urinalysis is essentially negative, ketones negative.   She had a CT of the abdomen and pelvis done which showed lung bases are clear. No focal abnormality. No ductal dilatation. Gallbladder is unremarkable.  Essentially negative.  She had an ultrasound of the abdomen done which showed no gallstones and no acute changes identified.   EKG: Sinus rhythm. Normal EKG. No acute ischemic changes.   IMPRESSION:  1. Intractable nausea, vomiting, and abdominal pain in the epigastric and right upper quadrant area. Suspect peptic ulcer disease versus gallbladder disease. 2. Chronic obstructive pulmonary disease, stable.  3. Gastroesophageal reflux disease. 4. Chronic back pain. 5. History of migraines.   PLAN: A 54 year old female, former smoker, with history of chronic obstructive pulmonary disease and uses oxygen at night and gastroesophageal reflux disease, on PPI, who denies use of any NSAIDs, who presented with abdominal pain and nausea and vomiting going on for about one month but has worsened for the last three days. Not able to tolerate anything p.o. She says she has been vomiting even fluids and water. She feels dehydrated, although her lab work is completely normal. Her BUN and creatinine are normal. Her bicarbonate is normal. There is no evidence of dehydration on the lab work. Her white count is normal and her urinalysis is negative for any ketones. I am going to give her some IV hydration. We will give her Zofran  and Phenergan for antiemesis. We will give her IV Dilaudid p.r.n. for pain management. I am also going to start her on IV PPI. Her CT of the abdomen and ultrasound of the abdomen are negative. We will order a HIDA scan to rule out any biliary tract disease, although her LFTs are normal. We will also call a Gastroenterology consult on her to see if she is a candidate for luminal evaluation or endoscopy. The plan was discussed with the patient and the husband. I will admit her to observation.   TIME SPENT ON ADMISSION AND COORDINATION OF CARE: 50 minutes. ____________________________ Fredia SorrowAbhinav Ziona Wickens, MD ag:slb D: 08/01/2011 16:30:20 ET     T: 08/01/2011 16:59:32 ET         JOB#: [id number] cc: Fredia SorrowAbhinav Claudina Oliphant, MD, <Dictator> Bari EdwardLaura Berglund, MD Fredia SorrowABHINAV Kaydee Magel MD ELECTRONICALLY SIGNED 08/13/2011 12:21

## 2014-10-02 NOTE — Consult Note (Signed)
Brief Consult Note: Diagnosis: 6 weeks abd pain.   Patient was seen by consultant.   Consult note dictated.   Recommend to proceed with surgery or procedure.   Comments: agree with plan for EGD and HIDA. suspect biliary dyskinesia. will follow.  Electronic Signatures: Lattie Hawooper, Sanah Kraska E (MD)  (Signed 21-Feb-13 20:33)  Authored: Brief Consult Note   Last Updated: 21-Feb-13 20:33 by Lattie Hawooper, Minsa Weddington E (MD)

## 2014-10-02 NOTE — Discharge Summary (Signed)
PATIENT NAME:  Kathleen Woodard, Kathleen Woodard MR#:  [id number] DATE OF BIRTH:  July 02, 1960  DATE OF ADMISSION:  08/01/2011 DATE OF DISCHARGE:  08/03/2011  DISCHARGE DIAGNOSES:  1. Abdominal pain, epigastric pain, nausea, and vomiting most likely secondary to gastritis, status post upper endoscopy. 2. Chronic obstructive pulmonary disease. 3. Gastroesophageal reflux disease. 4. History of migraines. 5. Chronic back pain.  CONSULTANTS: 1. Lynnae Prudeobert Elliott, MD - Gastroenterology. 2. Dionne Miloichard Cooper, MD - Surgery. 3. Duwaine MaxinWilliam Marterre, MD - Surgery.  PROCEDURE: Upper GI endoscopy.   HOSPITAL COURSE: This is a 54 year old female who has history of chronic obstructive pulmonary disease, gastroesophageal reflux disease, history of migraines and chronic back pain, also history of bipolar disorder. She presents with abdominal pain, nausea and vomiting going that was going on for a month. She said it worsened for three days prior to admission. She is unable to tolerate p.o. mainly complaining of epigastric and right upper quadrant pain. So she was admitted as intractable vomiting and epigastric and abdominal pain. Her initial work-up in the Emergency Room was essentially negative. She had a CT of the abdomen and pelvis with contrast which showed no acute abdominal or pelvic pathology. She also had an ultrasound of the abdomen done which showed no gallstones or acute changes identified. Her urinalysis was negative, nitrite and leukocyte esterase were negative. WBC was normal at 5.3 and hemoglobin stable at 14. Her LFTs were completely normal at admission. Creatinine was normal at 0.7. She said she has been vomiting for a month, but she was not dehydrated at all. Her BUN was normal at 5, her creatinine was normal, and her potassium was normal. She was started on IV hydration, IV Zofran and Phenergan p.r.n., IV PPI, and IV pain medication. Gastroenterology was consulted and surgery was consulted. The patient had a HIDA scan done  to rule out any biliary tract disease and the HIDA scan showed normal gallbladder ejection fraction of 62% and normal hepatobiliary scan. As per surgery, there is no objective surgical finding or intervention needed. The patient had an upper GI endoscopy on 08/02/2011 which showed gastritis, normal esophagus and normal duodenum. Gastroenterology suggested to give PPI twice a day and Carafate three times daily. Dr. Mechele CollinElliott has already given prescriptions for tramadol, Phenergan, Carafate, and Prevacid. I called the [facility]. They need preauthorization for Prevacid. The patient already has Prevacid at home, so the patient is going to use the Prevacid that she already has and then she can take Prilosec twice a day, instead of Prevacid, which I have already called in.  I advised her not to take any NSAIDs, ibuprofen, Aleve or BC Powder. The patient is still having nausea. She had one episode of vomiting today. The patient lost an IV access yesterday and is a very hard stick. The patient does not want to be restuck for an IV and does not want a central line, which I would agree. So the patient will be given a dose of IM Phenergan here before discharge along with subcutaneous Dilaudid and then she will take her pills at home. She has been advised a soft, bland diet. Her electrolytes have been stable during the hospital course. Her creatinine today is 0.5 and her sodium is 142 with a potassium of 3.5. She is well hydrated. The patient wants to go home and see how her medications do. I told her that the PPI and the Carafate will help in the healing of gastritis and it will take some time for healing. The patient  can follow up with Gastroenterology in 1 to 2 weeks, earlier if needed.   DISCHARGE MEDICATIONS: 1. Advair Diskus 250/50 twice a day. 2. DuoNebs twice a day. 3. Oxygen 2 liters as needed.  4. Pro Air two puffs three times daily.  5. Change Prevacid to 30 mg twice a day. 6. Carafate 1 gram p.o. three  times daily before meals, dissolve with water. 7. Tramadol 50 mg p.o. every 8 hours as needed for pain. 8. Phenergan 25 mg p.o. every 8 hours as needed for nausea or vomiting.   NOTE: All of the prescriptions have already been given by Dr. Mechele Collin on 08/02/2011. Do not take ibuprofen, Aleve, BC Powder's or Goody Powder's.  DISCHARGE DIET: Soft bland diet.   CONDITION AT DISCHARGE: The patient appears very frustrated. She is still having some nausea. T-max is 98.3, heart rate 77, blood pressure 120/90 and saturating 96% on room air. Chest is clear. Heart sounds are regular. Abdomen has minimal point tenderness, but soft with good bowel sounds.  DISCHARGE FOLLOWUP/INSTRUCTIONS: Followup with Dr. Judithann Graves in 1 to 2 days, followup BMP in Dr. Karn Cassis office, followup with Dr. Mechele Collin, [facility], in 1 to 2 weeks and earlier if needed.  TIME SPENT ON DISCHARGE: 55 minutes.  ____________________________ Fredia Sorrow, MD ag:slb D: 08/03/2011 14:40:44 ET T: 08/04/2011 14:03:19 ET JOB#: [id number]  cc: Fredia Sorrow, MD, <Dictator> Scot Jun, MD Bari Edward, MD Fredia Sorrow MD ELECTRONICALLY SIGNED 08/10/2011 13:32

## 2014-10-02 NOTE — Consult Note (Signed)
Patient with RUQ severe abd pain, weight loss, vomiting, nausea, going on for 6-8 weeks, severe tenderness on exam in RUQ.  She has wheezing on insp and exp, long hx of smoking 2 ppd, quit in last few weeks.  FH with lung cancer.  She needs HIDA scan and EGD, would like her to have less wheezing before EGD.  Requests pain med, nausea med and something to sleep.   Electronic Signatures: Scot JunElliott, Robert T (MD)  (Signed on 21-Feb-13 18:46)  Authored  Last Updated: 21-Feb-13 18:46 by Scot JunElliott, Robert T (MD)

## 2014-10-02 NOTE — Consult Note (Signed)
Pt had splotches of gastritis in distal body greater curve, biopsies done.  No ulcers anywhere.  Pt results from biliary studies pending.  From GI standpoint would give PPI twice a day and carafate bid mid morning and mid afternoon, soft diet.  Await biliary results and surgical plans.  I will be gone over weekend and Dr. Bluford Kaufmannh will cover.  Electronic Signatures: Scot JunElliott, Fermon Ureta T (MD)  (Signed on 22-Feb-13 12:38)  Authored  Last Updated: 22-Feb-13 12:38 by Scot JunElliott, Jauna Raczynski T (MD)

## 2014-10-02 NOTE — Consult Note (Signed)
PATIENT NAME:  Kathleen Woodard, Kathleen Woodard MR#:  [id number] DATE OF BIRTH:  [birth year]  DATE OF CONSULTATION:  09/18/2011  REFERRING PHYSICIAN:  Delfino Lovett, MD  CONSULTING PHYSICIAN:  Lurline Del, MD  REASON FOR CONSULTATION: Nausea, vomiting, abdominal pain and rectal bleeding.  HISTORY OF PRESENT ILLNESS: The patient is a 54 year old female with history of chronic obstructive pulmonary disease. The patient apparently has been having a problem with recurrent nausea, vomiting, and abdominal pain. She has seen Dr. Mechele Collin in the past and apparently underwent an upper GI endoscopy about three weeks ago which, according to her, showed gastritis. The patient was admitted again by Dr. Sherryll Burger yesterday when she presented with another episode of nausea and vomiting, also complaining of abdominal pain. The patient also gives history of some hematemesis that happened after she vomited several times. The patient denies any diarrhea or constipation but, according to her, she did pass a significant amount of bright red blood per rectum as well. As mentioned above, this appears to be a recurrent problem. The patient has had gastrointestinal work-up by Dr. Mechele Collin including an ultrasound of the gallbladder as well as a HIDA scan which were reported unremarkable. EGD was done a few weeks ago, as mentioned above. The patient is also claims a 30-pound weight loss. According to her, she does not want to see Dr. Mechele Collin again and appears to be unhappy with several physicians, including [employer]. She appears extremely apprehensive, gets upset very easily, and cries frequently. This appears to be due to a lot of social and family issues that she has been dealing with. Also she is complaining of diffuse abdominal pain. Discussing the case with Dr. Sherryll Burger revealed that Nursing staff has not really documented any vomiting in the hospital. Apparently she initially vomited after she tried to eat or burger, but since then there has been  no vomiting. No bleeding has been reported by the Nursing staff.   PAST MEDICAL HISTORY: Significant for: 1. History of chronic obstructive pulmonary disease. 2. Gastroesophageal reflux disease. 3. Gastritis. 4. Migraine.  5. Back pain. 6. History of recurrent nausea and vomiting with gastritis and otherwise fairly unremarkable examination recently.    PAST SURGICAL HISTORY:  1. Sinus surgery. 2. Vocal cord surgery. 3. History of back surgery.   ALLERGIES: Cipro, Imitrex, and codeine.   HOME MEDICATIONS: Percocet, Advair, DuoNebs, Carafate, Norco, omeprazole, oxygen, ProAir, promethazine and Reglan.   SOCIAL HISTORY: She used to be a smoker according to her, quit a few months ago.   FAMILY HISTORY: Both parents are deceased.   REVIEW OF SYSTEMS: Review of systems is negative except for what is mentioned in the history of present illness.   PHYSICAL EXAMINATION:  GENERAL: A fairly well-built female. She does not appear to be in any acute distress, very apprehensive and, as mentioned above, cries easily and gets upset very easily. She does not appear to be toxic or septic. She does not appear to be jaundiced.   VITAL SIGNS: Her vitals are fairly stable, temperature of 97.9, pulse 82, respirations 18 to 20, blood pressure 153/84.   LUNGS: Lungs are grossly clear to auscultation with a few scattered wheezes.   CARDIOVASCULAR: Regular rate and rhythm.   ABDOMEN: Soft and benign. Bowel signs are positive. Diffuse abdominal tenderness was noted on palpation, although very little tenderness was noted when she was distracted.   The rest of the physical examination appears to be grossly unremarkable.   LABORATORY, DIAGNOSTIC AND RADIOLOGICAL DATA: On admission, the patient's  hemoglobin was 10.9, which dropped down to 10.0 and 9.8, most likely secondary to hydration. Last month the patient's hemoglobin was about 12.0. Electrolytes are fairly unremarkable as well as BUN and creatinine.  Liver enzymes are normal. AST is somewhat elevated at 44, that is minimal; lipase is only 135.  ASSESSMENT AND PLAN: Patient with recurrent episodes of nausea and vomiting as well as abdominal pain. Work-up so far has been fairly unremarkable, including a HIDA scan as well as an ultrasound and an EGD. A CT scan of the abdomen last month was unremarkable as well. A lot of the patient's symptoms appear to be psychosomatic or functional as she appears to be in a lot of distress and very anxious. 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 The patient also is giving history of some rectal bleeding, although no rectal bleeding has been documented since admission. She is anemic and her hemoglobin and hematocrit have dropped slightly with hydration, although again as mentioned above there are no signs of active gastrointestinal bleeding. On evaluation yesterday, I recommended to the patient that I would like to review her records and previous work-up. An option was given for her to see Dr. Mechele CollinElliott which she refused. Further recommendations will be made after further review of her records. Her records were reviewed last night. The case was discussed with Dr. Delfino LovettVipul Shah this morning. Dr. Sherryll BurgerShah has discharged the patient with a follow-up appointment for her to follow up with me in two weeks. I agree as, again, as mentioned above there are no signs of vomiting or bleeding since hospitalization. She has been tolerating her regular diet fairly well as well. We will re-evaluate her as an outpatient. The patient will likely require a colonoscopy to further evaluate her anemia. The patient would likely require treatment for irritable bowel syndrome/nonulcer dyspepsia as well.   Thank you so much, Dr. Sherryll BurgerShah.  ____________________________ Lurline DelShaukat Oneta Sigman, MD si:cbb D: 09/18/2011 18:04:59 ET T: 09/19/2011 10:10:14 ET JOB#: [id number]  cc: Lurline DelShaukat Ilian Wessell, MD, <Dictator> Lurline DelSHAUKAT Lajeana Strough  MD ELECTRONICALLY SIGNED 09/20/2011 16:33

## 2014-10-02 NOTE — Consult Note (Signed)
Pt biliary studies were negative.  Will recommend treatment with carafate and bid PPI. Also I wrote prescription for tramadol, phenergan Follow up as needed. Advised to follow up in 2 weeks. Soft bland diet.  Avoid NSAID.  Electronic Signatures: Scot JunElliott, Sephiroth Mcluckie T (MD)  (Signed on 22-Feb-13 12:55)  Authored  Last Updated: 22-Feb-13 12:55 by Scot JunElliott, Derrich Gaby T (MD)

## 2014-10-02 NOTE — Discharge Summary (Signed)
PATIENT NAME:  Kathleen Woodard, Kathleen Woodard DATE OF BIRTH:  [birth year]  DATE OF ADMISSION:  09/17/2011 DATE OF DISCHARGE:  09/18/2011  DISCHARGE DIAGNOSES:  1. Gastritis likely causing nausea and vomiting along with abdominal pain, now much better, tolerating diet.  2. Anemia, likely hemodilutional. No documented bleed while in the hospital. She remained hemodynamically stable.  3. Possible rectal bleed with no objective evidence while in the hospital. She remained hemodynamically stable.  4. Hypokalemia, repleted.   5. Hypomagnesemia, repleted.   SECONDARY DIAGNOSES:  1. Chronic obstructive pulmonary disease. 2. Gastroesophageal reflux disease.  3. Gastritis.  4. Migraine.  5. Chronic back pain.   CONSULTATION: GI, Dr. Niel HummerIftikhar   MAJOR LABORATORY PANEL: Urinalysis on admission was negative.   HISTORY AND SHORT HOSPITAL COURSE: The patient is a 54 year old female with above-mentioned medical problems who was admitted for nausea, vomiting, and abdominal pain. There was also concern for possible rectal bleed. GI consultation was obtained with Dr. Niel HummerIftikhar who recommended conservative management and if not better possible colonoscopy. The patient was started on proton pump inhibitor and her nausea and vomiting was monitored while in the hospital. She did not have any further vomiting while in the hospital. No further rectal bleed was noted in the hospital either. She was started on a diet and was tolerating it fine. After discussion with GI and patient, she was discharged home in stable condition. The patient was very clear in not following with Kathleen Woodard, [facility], or any other tertiary care center. She preferred staying with Kathleen Woodard. She did not want to see Dr. Mechele CollinElliott either. This was discussed with the patient and we will respect her wishes. I have also involved Patient Relations from the hospital along with nursing supervisor as there was concern for possible malingering and the patient  taking her own medications which were not ordered.   DISPOSITION: On April 10th she was discharged home in stable condition.   On the date of discharge her vital signs were as follows: Temperature 97.9, heart rate 82 per minute, respirations 20 per minute, blood pressure 153/84 mmHg. She was saturating 96% on room air.   PERTINENT PHYSICAL EXAMINATION: CARDIOVASCULAR: S1, S2 normal. No murmurs, rubs, or gallop. LUNGS: Clear to auscultation bilaterally. No wheezing, rales, rhonchi, or crepitation. ABDOMEN: Soft, benign. NEUROLOGIC: Nonfocal examination. All other physical examination remained at the baseline.   DISCHARGE MEDICATIONS:  1. Promethazine 25 mg p.o. every six hours.  2. Phenadoz 25 mg suppository one per rectum every six hours as needed.  3. Acetaminophen/hydrocodone 325/5 mg 1 tablet p.o. every six hours as needed.  4. Reglan 10 mg three times a day as needed.  5. Promethazine 25 mg p.o. every six hours as needed.  6. Advair 250/50 1 puff b.i.d.  7. DuoNeb every 12 hours by nebulizer. 8. Oxygen 2 liters via nasal cannula as needed.  9. ProAir 2 puffs inhaled 3 times a day.  10. Carafate 1 gram p.o. 3 times a day.  11. Omeprazole 20 mg p.o. daily.  12. Tylenol 650 mg p.o. every six hours as needed.   DISCHARGE DIET: Low sodium. Eat light for the first meal.   DISCHARGE ACTIVITY: As tolerated.   DISCHARGE INSTRUCTIONS AND FOLLOW-UP:  1. The patient was instructed to follow-up with her primary care physician, Dr. Bari EdwardLaura Berglund, in one week.  2. She will need follow-up with GI, Dr. Niel HummerIftikhar, in 1 to 2 weeks.  3. She was instructed not to take any narcotics if at all  possible and take narcotic if prescribed by her physician only.   TOTAL TIME DISCHARGING THIS PATIENT: 45 minutes.   ____________________________ Ellamae Sia. Sherryll Burger, MD vss:drc D: 09/22/2011 10:13:40 ET T: 09/24/2011 10:14:20 ET JOB#: [id number]  cc: Jacquise Rarick S. Sherryll Burger, MD, <Dictator> Bari Edward, MD Lurline Del, MD Ellamae Sia [facility] MD ELECTRONICALLY SIGNED 09/24/2011 10:57

## 2015-02-09 ENCOUNTER — Encounter: Payer: Self-pay | Admitting: Internal Medicine

## 2015-02-09 ENCOUNTER — Other Ambulatory Visit: Payer: Self-pay | Admitting: Internal Medicine

## 2015-02-09 ENCOUNTER — Other Ambulatory Visit: Payer: Self-pay

## 2015-02-09 MED ORDER — TIZANIDINE HCL 4 MG PO CAPS: 4.0000 mg | ORAL_CAPSULE | Freq: Three times a day (TID) | ORAL | Status: DC

## 2015-02-09 MED ORDER — BUTALBITAL-APAP-CAFFEINE 50-325-40 MG PO TABS: 1.0000 | ORAL_TABLET | Freq: Three times a day (TID) | ORAL | Status: DC | PRN

## 2015-02-11 IMAGING — CR DG ABDOMEN 2V
1 series · 2 of 2 positions shown · non-contrast
Comparison: CT abdomen and pelvis 11/30/2012.

CLINICAL DATA: Diffuse abdominal pain.  Vomiting.

EXAM:
ABDOMEN - 2 VIEW

[Series 1: w abdomen upright · 0.14mm/px · 2 of 2 slices shown]
[im 1/2]
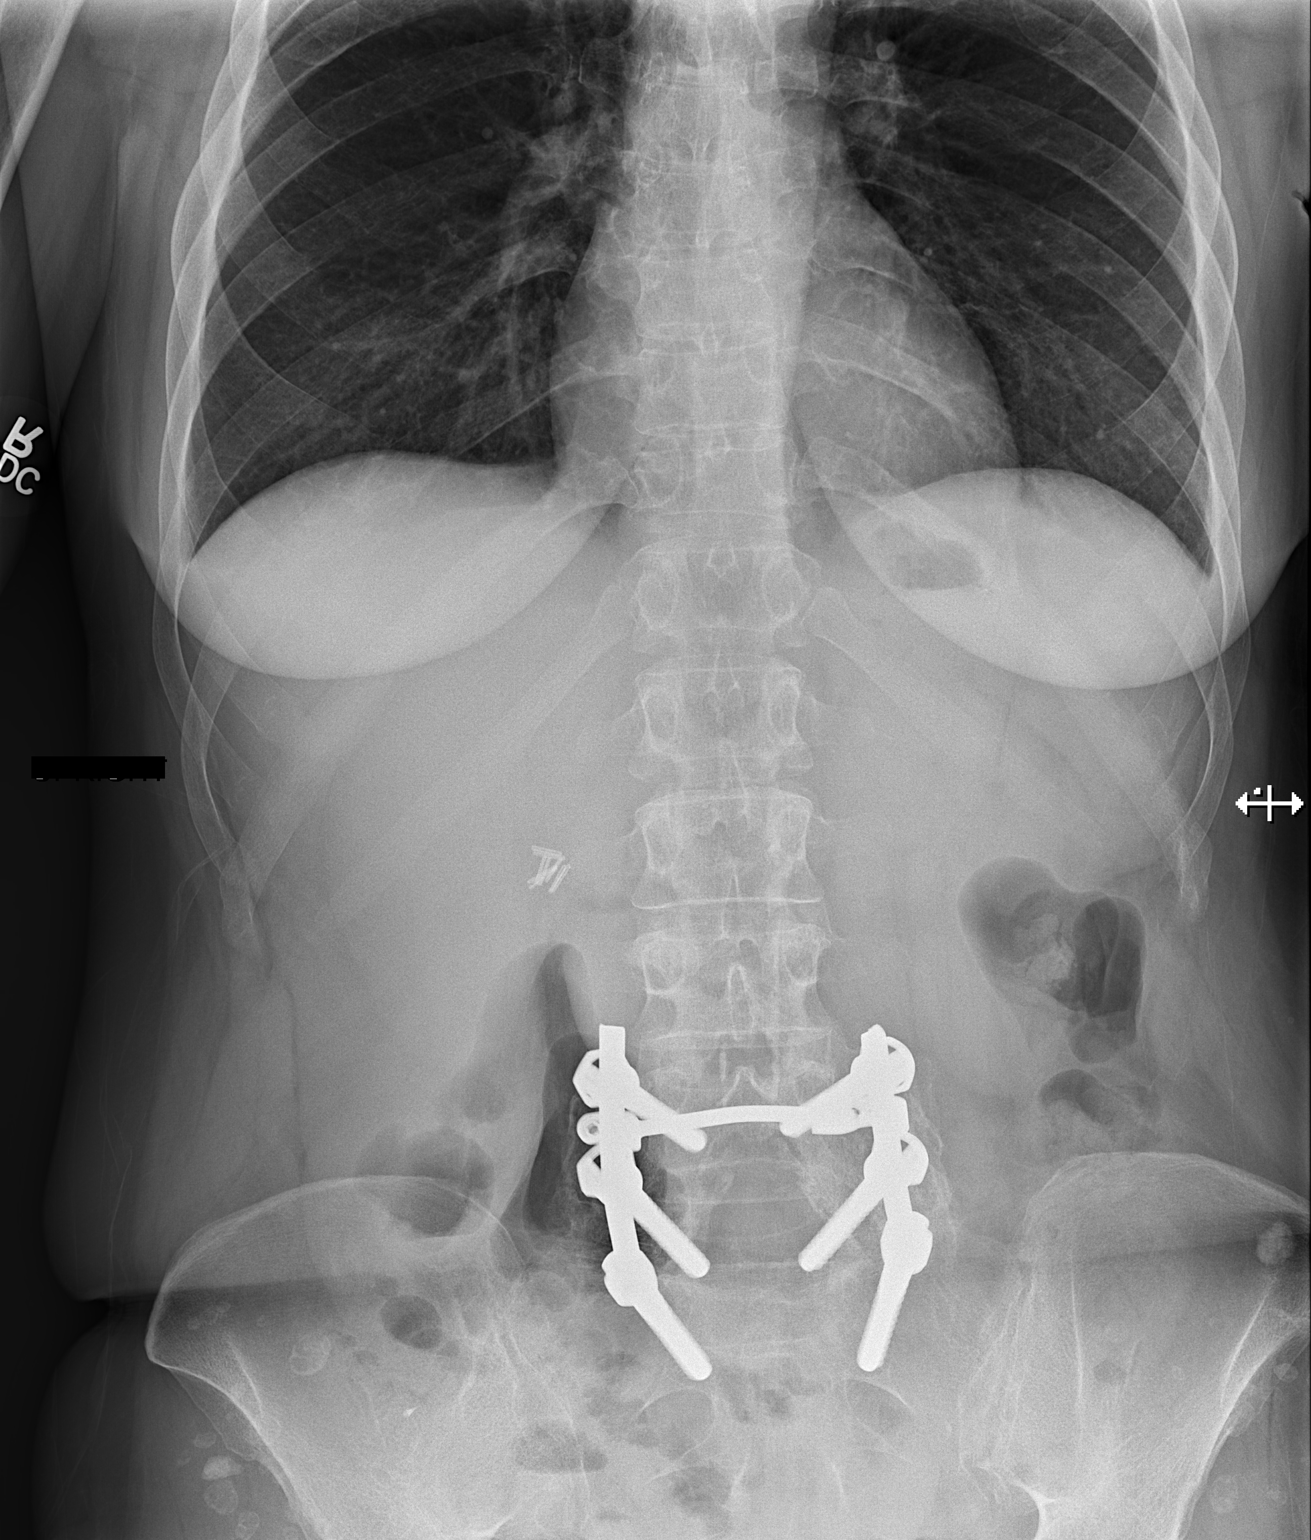
[im 2/2]
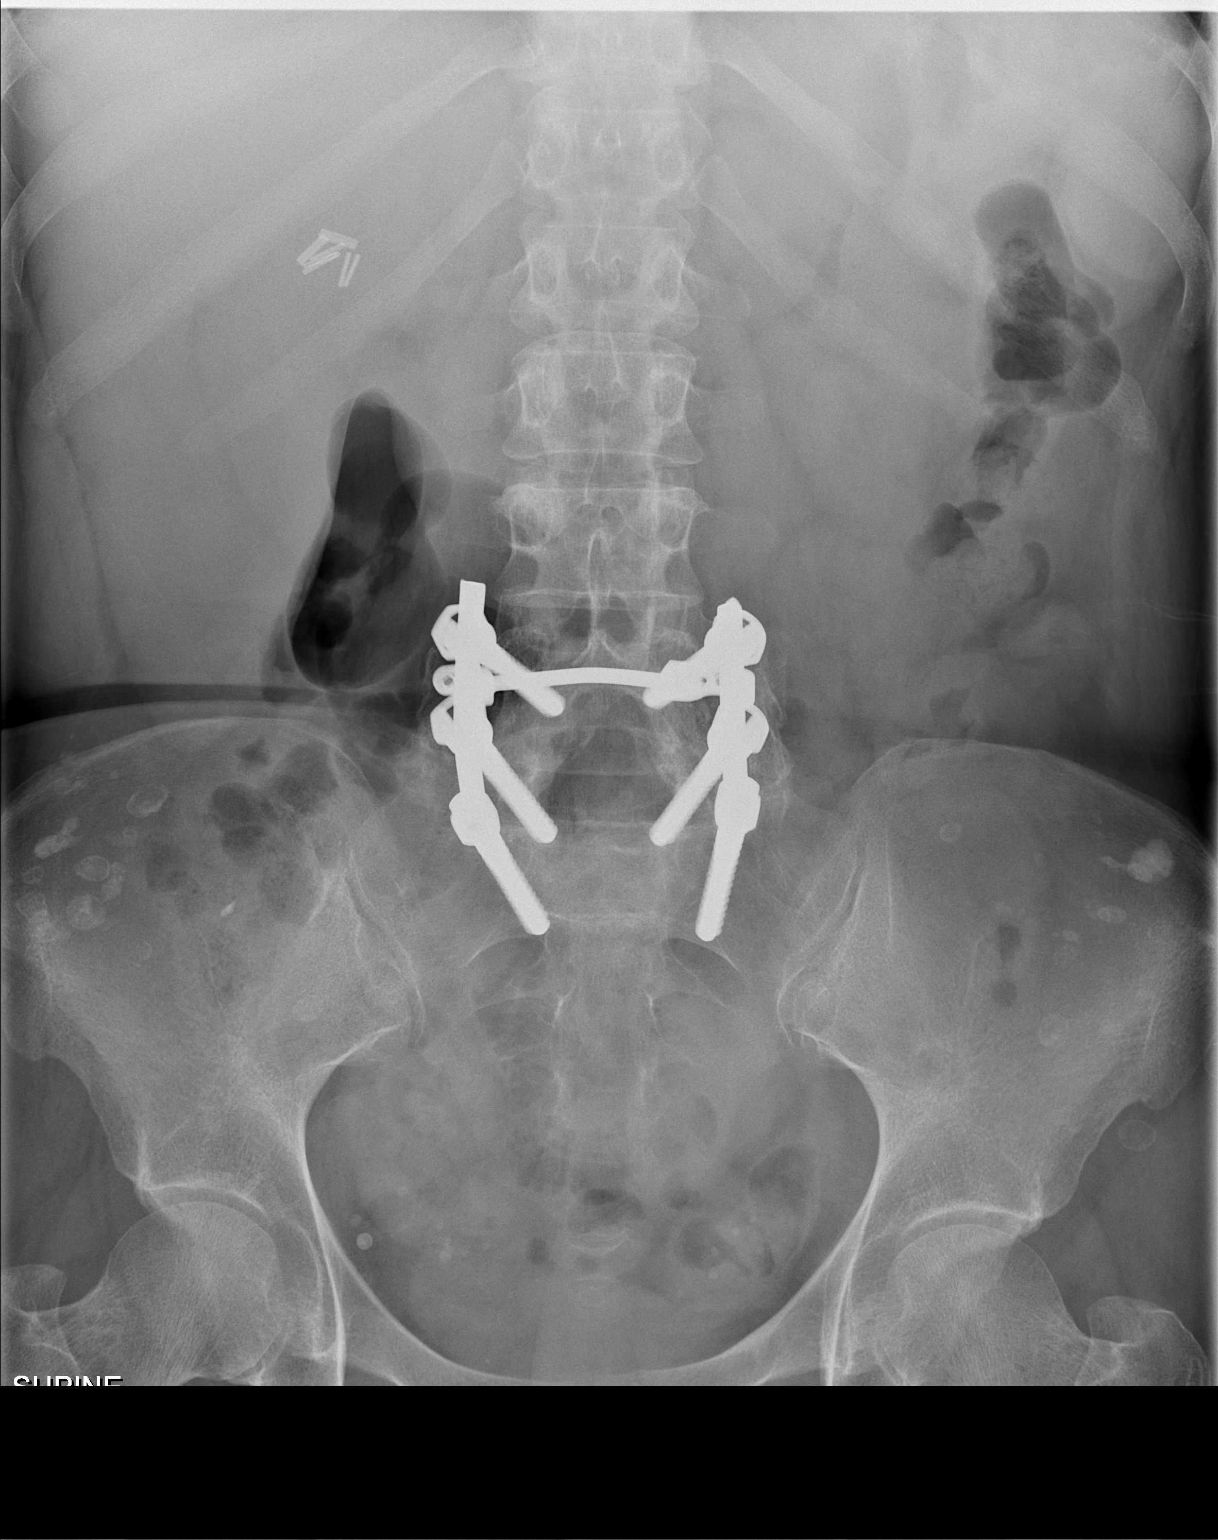

[2 of 2 positions shown; findings below may reference images not displayed]

FINDINGS: The lung bases are clear. Postsurgical changes are noted in the
lower lumbar spine at L4-5 and L5-S1.

The bowel gas pattern is unremarkable. There is no evidence for
obstruction or free air. Surgical clips are noted at the gallbladder
fossa. Multiple injection granulomata are again noted of the soft
tissues of the pelvis.
IMPRESSION: 1. Negative bowel gas pattern without evidence for obstruction or
free air.
2. Postoperative changes of the lower lumbar spine.

## 2015-02-15 ENCOUNTER — Other Ambulatory Visit: Payer: Self-pay

## 2015-02-16 MED ORDER — ALBUTEROL SULFATE (2.5 MG/3ML) 0.083% IN NEBU: 2.5000 mg | INHALATION_SOLUTION | RESPIRATORY_TRACT | Status: DC | PRN

## 2015-03-22 ENCOUNTER — Encounter: Payer: Self-pay | Admitting: Internal Medicine

## 2015-03-22 ENCOUNTER — Other Ambulatory Visit: Payer: Self-pay | Admitting: Internal Medicine

## 2015-03-22 DIAGNOSIS — Z86010 Personal history of colonic polyps: Secondary | ICD-10-CM | POA: Insufficient documentation

## 2015-03-22 DIAGNOSIS — R32 Unspecified urinary incontinence: Secondary | ICD-10-CM | POA: Insufficient documentation

## 2015-03-22 DIAGNOSIS — G43519 Persistent migraine aura without cerebral infarction, intractable, without status migrainosus: Secondary | ICD-10-CM | POA: Insufficient documentation

## 2015-03-22 DIAGNOSIS — K274 Chronic or unspecified peptic ulcer, site unspecified, with hemorrhage: Secondary | ICD-10-CM | POA: Insufficient documentation

## 2015-03-22 DIAGNOSIS — F411 Generalized anxiety disorder: Secondary | ICD-10-CM | POA: Insufficient documentation

## 2015-03-22 DIAGNOSIS — M62838 Other muscle spasm: Secondary | ICD-10-CM | POA: Insufficient documentation

## 2015-03-22 DIAGNOSIS — J439 Emphysema, unspecified: Secondary | ICD-10-CM | POA: Insufficient documentation

## 2015-03-22 DIAGNOSIS — F5101 Primary insomnia: Secondary | ICD-10-CM | POA: Insufficient documentation

## 2015-05-02 ENCOUNTER — Other Ambulatory Visit: Payer: Self-pay

## 2015-05-02 MED ORDER — ALBUTEROL SULFATE (2.5 MG/3ML) 0.083% IN NEBU: 2.5000 mg | INHALATION_SOLUTION | RESPIRATORY_TRACT | Status: AC | PRN

## 2015-05-11 ENCOUNTER — Ambulatory Visit (INDEPENDENT_AMBULATORY_CARE_PROVIDER_SITE_OTHER): Payer: 59 | Admitting: Internal Medicine

## 2015-05-11 ENCOUNTER — Encounter: Payer: Self-pay | Admitting: Internal Medicine

## 2015-05-11 VITALS — BP 176/90 | HR 120 | Ht 62.0 in | Wt 144.8 lb

## 2015-05-11 DIAGNOSIS — G43009 Migraine without aura, not intractable, without status migrainosus: Secondary | ICD-10-CM | POA: Diagnosis not present

## 2015-05-11 DIAGNOSIS — Z1239 Encounter for other screening for malignant neoplasm of breast: Secondary | ICD-10-CM

## 2015-05-11 DIAGNOSIS — J439 Emphysema, unspecified: Secondary | ICD-10-CM

## 2015-05-11 DIAGNOSIS — I10 Essential (primary) hypertension: Secondary | ICD-10-CM | POA: Insufficient documentation

## 2015-05-11 DIAGNOSIS — K274 Chronic or unspecified peptic ulcer, site unspecified, with hemorrhage: Secondary | ICD-10-CM

## 2015-05-11 DIAGNOSIS — M6248 Contracture of muscle, other site: Secondary | ICD-10-CM

## 2015-05-11 DIAGNOSIS — Z0000 Encounter for general adult medical examination without abnormal findings: Secondary | ICD-10-CM

## 2015-05-11 DIAGNOSIS — M62838 Other muscle spasm: Secondary | ICD-10-CM

## 2015-05-11 LAB — POCT URINALYSIS DIPSTICK
Bilirubin, UA: NEGATIVE
Glucose, UA: NEGATIVE
Ketones, UA: NEGATIVE
Leukocytes, UA: NEGATIVE
Nitrite, UA: NEGATIVE
PROTEIN UA: NEGATIVE
RBC UA: NEGATIVE
UROBILINOGEN UA: 0.2
pH, UA: 8

## 2015-05-11 MED ORDER — FLUTICASONE-SALMETEROL 250-50 MCG/DOSE IN AEPB: 1.0000 | INHALATION_SPRAY | Freq: Two times a day (BID) | RESPIRATORY_TRACT | Status: DC

## 2015-05-11 MED ORDER — ALBUTEROL SULFATE HFA 108 (90 BASE) MCG/ACT IN AERS: 2.0000 | INHALATION_SPRAY | Freq: Four times a day (QID) | RESPIRATORY_TRACT | Status: DC | PRN

## 2015-05-11 MED ORDER — BUTALBITAL-APAP-CAFFEINE 50-325-40 MG PO TABS: 1.0000 | ORAL_TABLET | Freq: Three times a day (TID) | ORAL | Status: DC | PRN

## 2015-05-11 MED ORDER — AMLODIPINE BESYLATE 5 MG PO TABS: 5.0000 mg | ORAL_TABLET | Freq: Every day | ORAL | Status: DC

## 2015-05-11 MED ORDER — TIZANIDINE HCL 4 MG PO CAPS: 4.0000 mg | ORAL_CAPSULE | Freq: Three times a day (TID) | ORAL | Status: DC

## 2015-05-11 MED ORDER — PROMETHAZINE HCL 25 MG PO TABS: 25.0000 mg | ORAL_TABLET | Freq: Four times a day (QID) | ORAL | Status: DC | PRN

## 2015-05-11 NOTE — Progress Notes (Signed)
Date:  05/11/2015   Name:  Kathleen Woodard   DOB:  04-Aug-1960   MRN:  [id number]   Chief Complaint: Annual Exam Kathleen Woodard is a 54 y.o. female who presents today for her Complete Annual Exam. She feels fairly well. She reports exercising none. She reports she is sleeping fairly well. She denies breast problems.  She had a total hysterectomy in her 62's.  She has declined mammograms in the past but agrees to one today.  Migraine  Associated symptoms include coughing. Pertinent negatives include no abdominal pain, back pain, fever, hearing loss, numbness, sore throat or vomiting. Her past medical history is significant for hypertension.  Gastroesophageal Reflux She complains of coughing. She reports no abdominal pain, no chest pain, no heartburn, no sore throat or no wheezing. Pertinent negatives include no fatigue. She has tried a PPI for the symptoms. The treatment provided moderate relief.  Hypertension This is a new problem. The current episode started 1 to 4 weeks ago. The problem has been gradually improving since onset. Associated symptoms include headaches and shortness of breath. Pertinent negatives include no chest pain or palpitations. There are no associated agents to hypertension. Past treatments include calcium channel blockers (started amlodipine 5 mg one month ago).  COPD - Symptoms are stable. She needs a refill on Advair and her rescue inhaler. She has minimal cough, with mild shortness of breath on extreme exertion. She remains tobacco free for 3 years.   Review of Systems  Constitutional: Negative for fever, chills, fatigue and unexpected weight change.  HENT: Negative for hearing loss, sore throat, trouble swallowing and voice change.   Respiratory: Positive for cough and shortness of breath. Negative for chest tightness and wheezing.   Cardiovascular: Negative for chest pain, palpitations and leg swelling.  Gastrointestinal: Negative for heartburn, vomiting, abdominal  pain and blood in stool.  Genitourinary: Negative for dysuria, hematuria, vaginal discharge and vaginal pain.  Musculoskeletal: Negative for myalgias and back pain.  Skin: Negative for rash.  Neurological: Positive for headaches. Negative for tremors, syncope and numbness.  Hematological: Negative for adenopathy. Does not bruise/bleed easily.  Psychiatric/Behavioral: Negative for sleep disturbance, self-injury and dysphoric mood. The patient is not nervous/anxious.     Patient Active Problem List   Diagnosis Date Noted  . Essential hypertension 05/11/2015  . Migraine without aura and responsive to treatment 03/22/2015  . Chronic peptic ulcer with hemorrhage but without obstruction 03/22/2015  . Incontinence 03/22/2015  . Anxiety, generalized 03/22/2015  . History of colon polyps 03/22/2015  . Idiopathic insomnia 03/22/2015  . Chronic obstructive pulmonary emphysema (HCC) 03/22/2015  . Muscle spasms of neck 03/22/2015    Prior to Admission medications   Medication Sig Start Date End Date Taking? Authorizing Provider  albuterol (PROVENTIL) (2.5 MG/3ML) 0.083% nebulizer solution Inhale 3 mLs (2.5 mg total) into the lungs every 4 (four) hours as needed for wheezing or shortness of breath. 05/02/15  Yes Reubin Milan, MD  bisacodyl (DULCOLAX) 5 MG EC tablet Take by mouth.   Yes Historical Provider, MD  butalbital-acetaminophen-caffeine (FIORICET, ESGIC) 50-325-40 MG per tablet Take 1 tablet by mouth 3 (three) times daily as needed for headache. 02/09/15  Yes Reubin Milan, MD  Fluticasone-Salmeterol (ADVAIR DISKUS) 250-50 MCG/DOSE AEPB Inhale 1 puff into the lungs 2 (two) times daily. 02/02/14  Yes Historical Provider, MD  promethazine (PHENERGAN) 25 MG tablet Take 1 tablet by mouth 4 (four) times daily as needed.   Yes Historical Provider, MD  tiZANidine (ZANAFLEX)  4 MG capsule Take 1 capsule (4 mg total) by mouth 3 (three) times daily. 02/09/15  Yes Reubin MilanLaura H Jaimon Bugaj, MD    Allergies   Allergen Reactions  . Triptans Shortness Of Breath    Imitrex  . Ciprofloxacin Hives  . Codeine Diarrhea, Nausea Only and Nausea And Vomiting  . Zolpidem Other (See Comments)    Confusion    Past Surgical History  Procedure Laterality Date  . Cholecystectomy    . Total abdominal hysterectomy  1985  . Lumbar fusion    . Vocal cord polyp resection    . Esophagogastroduodenoscopy  2013    gastritis  . Colonoscopy  2013    hyperplastic polyp    Social History  Substance Use Topics  . Smoking status: Former Games developermoker  . Smokeless tobacco: None  . Alcohol Use: No    Medication list has been reviewed and updated.   Physical Exam  Constitutional: She is oriented to person, place, and time. She appears well-developed and well-nourished. No distress.  HENT:  Head: Normocephalic and atraumatic.  Right Ear: Tympanic membrane and ear canal normal.  Left Ear: Tympanic membrane and ear canal normal.  Nose: Right sinus exhibits no maxillary sinus tenderness. Left sinus exhibits no maxillary sinus tenderness.  Mouth/Throat: Uvula is midline and oropharynx is clear and moist.  Eyes: Conjunctivae and EOM are normal. Right eye exhibits no discharge. Left eye exhibits no discharge. No scleral icterus.  Neck: Normal range of motion. Neck supple. Carotid bruit is not present. No erythema present. No thyromegaly present.  Cardiovascular: Normal rate, regular rhythm, normal heart sounds and normal pulses.   Pulmonary/Chest: Effort normal. No respiratory distress. She has decreased breath sounds. She has no wheezes. She has no rhonchi. Right breast exhibits no mass, no nipple discharge, no skin change and no tenderness. Left breast exhibits no mass, no nipple discharge, no skin change and no tenderness.  Abdominal: Soft. Bowel sounds are normal. There is no hepatosplenomegaly. There is no tenderness. There is no CVA tenderness.  Musculoskeletal: Normal range of motion. She exhibits no edema or  tenderness.  Lymphadenopathy:    She has no cervical adenopathy.    She has no axillary adenopathy.  Neurological: She is alert and oriented to person, place, and time. She has normal reflexes. No cranial nerve deficit or sensory deficit.  Skin: Skin is warm, dry and intact. No rash noted.  Psychiatric: She has a normal mood and affect. Her speech is normal and behavior is normal. Thought content normal.  Nursing note and vitals reviewed.   BP 176/90 mmHg  Pulse 120  Ht 5\' 2"  (1.575 m)  Wt 144 lb 12.8 oz (65.681 kg)  BMI 26.48 kg/m2  Assessment and Plan: 1. Annual physical exam Patient is stable with no recurrent or worsening symptoms - POCT urinalysis dipstick - Lipid panel  2. Essential hypertension New-onset; continue current medication and monitor blood pressure at home - amLODipine (NORVASC) 5 MG tablet; Take 1 tablet (5 mg total) by mouth daily.  Dispense: 90 tablet; Refill: 1 - CBC with Differential/Platelet - Comprehensive metabolic panel - TSH  3. Migraine without aura and responsive to treatment Stable - butalbital-acetaminophen-caffeine (FIORICET, ESGIC) 50-325-40 MG tablet; Take 1 tablet by mouth 3 (three) times daily as needed for headache.  Dispense: 270 tablet; Refill: 1 - promethazine (PHENERGAN) 25 MG tablet; Take 1 tablet (25 mg total) by mouth 4 (four) times daily as needed.  Dispense: 30 tablet; Refill: 5  4. Pulmonary emphysema, unspecified  emphysema type (HCC) Stable symptoms Patient congratulated on remaining tobacco free - Fluticasone-Salmeterol (ADVAIR DISKUS) 250-50 MCG/DOSE AEPB; Inhale 1 puff into the lungs 2 (two) times daily.  Dispense: 60 each; Refill: 5 - albuterol (PROVENTIL HFA;VENTOLIN HFA) 108 (90 BASE) MCG/ACT inhaler; Inhale 2 puffs into the lungs every 6 (six) hours as needed for wheezing or shortness of breath.  Dispense: 1 Inhaler; Refill: 0  5. Muscle spasms of neck - tiZANidine (ZANAFLEX) 4 MG capsule; Take 1 capsule (4 mg total) by  mouth 3 (three) times daily.  Dispense: 270 capsule; Refill: 1  6. Chronic peptic ulcer with hemorrhage but without obstruction On over-the-counter Nexium daily Followed closely by GI  7. Breast cancer screening - MM DIGITAL SCREENING BILATERAL; Future   Bari Edward, MD [facility]  05/11/2015

## 2015-06-07 ENCOUNTER — Telehealth: Payer: Self-pay

## 2015-06-07 NOTE — Telephone Encounter (Signed)
Left message to patient regarding lab work. Told to call with questions.dr

## 2015-06-12 ENCOUNTER — Other Ambulatory Visit: Payer: Self-pay | Admitting: Internal Medicine

## 2015-06-15 ENCOUNTER — Other Ambulatory Visit: Payer: Self-pay | Admitting: Internal Medicine

## 2015-06-15 MED ORDER — ALBUTEROL SULFATE HFA 108 (90 BASE) MCG/ACT IN AERS: 2.0000 | INHALATION_SPRAY | Freq: Four times a day (QID) | RESPIRATORY_TRACT | Status: DC | PRN

## 2015-11-07 ENCOUNTER — Telehealth: Payer: Self-pay

## 2015-11-07 NOTE — Telephone Encounter (Signed)
Patient needs refills on Tizanidine and Fioricet. [facility] [place].

## 2015-11-07 NOTE — Telephone Encounter (Signed)
Patient needs to schedule 6 month follow up due now. She can get the Rx's at that visit.

## 2015-11-08 NOTE — Telephone Encounter (Signed)
Has appt June 2 and will discuss refills and labs at that visit.

## 2015-11-08 NOTE — Telephone Encounter (Signed)
Patient requesting 30 day supply at leastt as she is going on vacation and will schedule asap.

## 2015-11-10 ENCOUNTER — Ambulatory Visit (INDEPENDENT_AMBULATORY_CARE_PROVIDER_SITE_OTHER): Payer: Managed Care, Other (non HMO) | Admitting: Internal Medicine

## 2015-11-10 ENCOUNTER — Encounter: Payer: Self-pay | Admitting: Internal Medicine

## 2015-11-10 ENCOUNTER — Other Ambulatory Visit: Payer: Self-pay | Admitting: Internal Medicine

## 2015-11-10 VITALS — BP 150/96 | HR 84 | Resp 16 | Ht 62.0 in | Wt 147.0 lb

## 2015-11-10 DIAGNOSIS — I10 Essential (primary) hypertension: Secondary | ICD-10-CM

## 2015-11-10 DIAGNOSIS — J439 Emphysema, unspecified: Secondary | ICD-10-CM

## 2015-11-10 DIAGNOSIS — K274 Chronic or unspecified peptic ulcer, site unspecified, with hemorrhage: Secondary | ICD-10-CM | POA: Diagnosis not present

## 2015-11-10 DIAGNOSIS — K559 Vascular disorder of intestine, unspecified: Secondary | ICD-10-CM | POA: Insufficient documentation

## 2015-11-10 DIAGNOSIS — Z86010 Personal history of colonic polyps: Secondary | ICD-10-CM

## 2015-11-10 DIAGNOSIS — G43009 Migraine without aura, not intractable, without status migrainosus: Secondary | ICD-10-CM

## 2015-11-10 DIAGNOSIS — Z1239 Encounter for other screening for malignant neoplasm of breast: Secondary | ICD-10-CM | POA: Diagnosis not present

## 2015-11-10 DIAGNOSIS — M6248 Contracture of muscle, other site: Secondary | ICD-10-CM | POA: Diagnosis not present

## 2015-11-10 DIAGNOSIS — M62838 Other muscle spasm: Secondary | ICD-10-CM

## 2015-11-10 MED ORDER — TIZANIDINE HCL 4 MG PO CAPS: 4.0000 mg | ORAL_CAPSULE | Freq: Three times a day (TID) | ORAL | Status: DC

## 2015-11-10 MED ORDER — PROMETHAZINE HCL 25 MG PO TABS: 25.0000 mg | ORAL_TABLET | Freq: Four times a day (QID) | ORAL | Status: DC | PRN

## 2015-11-10 MED ORDER — FLUTICASONE-SALMETEROL 250-50 MCG/DOSE IN AEPB: 1.0000 | INHALATION_SPRAY | Freq: Two times a day (BID) | RESPIRATORY_TRACT | Status: DC

## 2015-11-10 MED ORDER — AMLODIPINE BESYLATE 5 MG PO TABS: 5.0000 mg | ORAL_TABLET | Freq: Every day | ORAL | Status: DC

## 2015-11-10 MED ORDER — BUTALBITAL-APAP-CAFFEINE 50-325-40 MG PO TABS: 1.0000 | ORAL_TABLET | Freq: Three times a day (TID) | ORAL | Status: DC | PRN

## 2015-11-10 MED ORDER — ALBUTEROL SULFATE HFA 108 (90 BASE) MCG/ACT IN AERS: 2.0000 | INHALATION_SPRAY | Freq: Four times a day (QID) | RESPIRATORY_TRACT | Status: DC | PRN

## 2015-11-10 NOTE — Progress Notes (Signed)
Date:  11/10/2015   Name:  Kathleen Woodard   DOB:  05/30/1961   MRN:  [id number]   Chief Complaint: Hypertension and Migraine Hypertension This is a chronic problem. The current episode started more than 1 year ago. The problem is unchanged. The problem is controlled. Associated symptoms include headaches and shortness of breath. Pertinent negatives include no chest pain, neck pain or palpitations. Past treatments include calcium channel blockers. The current treatment provides significant improvement. There are no compliance problems.   Migraine  This is a chronic problem. The problem has been gradually improving. The pain is located in the bilateral region. Associated symptoms include back pain. Pertinent negatives include no abdominal pain, fever, neck pain, numbness or seizures. Treatments tried: she takes butalbital tid - it is the only treatment that her migraines have responded to. The treatment provided significant relief. Her past medical history is significant for hypertension.   Colitis - dx'd in January in [place].  She has colonoscopy and EGD.  Treated with a course of antibiotics and sulfasalazine.  Now only on omeprazole.  She will have follow up with Dr. Merrie Woodard.    Review of Systems  Constitutional: Negative for fever, chills, fatigue and unexpected weight change.  HENT: Negative for trouble swallowing.   Respiratory: Positive for shortness of breath and wheezing. Negative for chest tightness.   Cardiovascular: Negative for chest pain, palpitations and leg swelling.  Gastrointestinal: Negative for abdominal pain, diarrhea and blood in stool.  Musculoskeletal: Positive for back pain. Negative for neck pain and neck stiffness.  Neurological: Positive for headaches. Negative for seizures, syncope and numbness.  Psychiatric/Behavioral: Negative for sleep disturbance. The patient is not nervous/anxious.     Patient Active Problem List   Diagnosis Date Noted  .  Essential hypertension 05/11/2015  . Migraine without aura and responsive to treatment 03/22/2015  . Chronic peptic ulcer with hemorrhage but without obstruction 03/22/2015  . Incontinence 03/22/2015  . Anxiety, generalized 03/22/2015  . History of colon polyps 03/22/2015  . Idiopathic insomnia 03/22/2015  . Chronic obstructive pulmonary emphysema (HCC) 03/22/2015  . Muscle spasms of neck 03/22/2015    Prior to Admission medications   Medication Sig Start Date End Date Taking? Authorizing Provider  albuterol (PROVENTIL HFA;VENTOLIN HFA) 108 (90 Base) MCG/ACT inhaler Inhale 2 puffs into the lungs every 6 (six) hours as needed for wheezing or shortness of breath. 06/15/15  Yes Reubin MilanLaura H Tatia Petrucci, MD  albuterol (PROVENTIL) (2.5 MG/3ML) 0.083% nebulizer solution Inhale 3 mLs (2.5 mg total) into the lungs every 4 (four) hours as needed for wheezing or shortness of breath. 05/02/15  Yes Reubin MilanLaura H Seng Fouts, MD  amLODipine (NORVASC) 5 MG tablet Take 1 tablet (5 mg total) by mouth daily. 05/11/15  Yes Reubin MilanLaura H Dontrail Blackwell, MD  ASPIRIN LOW DOSE 81 MG chewable tablet  08/14/15  Yes Historical Provider, MD  bisacodyl (DULCOLAX) 5 MG EC tablet Take by mouth.   Yes Historical Provider, MD  butalbital-acetaminophen-caffeine (FIORICET, ESGIC) 50-325-40 MG tablet Take 1 tablet by mouth 3 (three) times daily as needed for headache. 05/11/15  Yes Reubin MilanLaura H Indiyah Paone, MD  Fluticasone-Salmeterol (ADVAIR DISKUS) 250-50 MCG/DOSE AEPB Inhale 1 puff into the lungs 2 (two) times daily. 05/11/15  Yes Reubin MilanLaura H Brodin Gelpi, MD  omeprazole (PRILOSEC) 40 MG capsule Take 1 capsule by mouth daily. 10/13/15  Yes Historical Provider, MD  promethazine (PHENERGAN) 25 MG tablet Take 1 tablet (25 mg total) by mouth 4 (four) times daily as needed. 05/11/15  Yes Reubin Milan, MD  tiZANidine (ZANAFLEX) 4 MG capsule Take 1 capsule (4 mg total) by mouth 3 (three) times daily. 05/11/15  Yes Reubin Milan, MD    Allergies  Allergen Reactions  . Triptans  Shortness Of Breath    Imitrex  . Ciprofloxacin Hives  . Codeine Diarrhea, Nausea Only and Nausea And Vomiting  . Sumatriptan Succinate Other (See Comments)  . Zolpidem Other (See Comments)    Confusion    Past Surgical History  Procedure Laterality Date  . Cholecystectomy    . Total abdominal hysterectomy  1985  . Lumbar fusion    . Vocal cord polyp resection    . Esophagogastroduodenoscopy  2013    gastritis  . Colonoscopy  2013    hyperplastic polyp    Social History  Substance Use Topics  . Smoking status: Former Games developer  . Smokeless tobacco: None  . Alcohol Use: No     Medication list has been reviewed and updated.   Physical Exam  Constitutional: She is oriented to person, place, and time. She appears well-developed. No distress.  HENT:  Head: Normocephalic and atraumatic.  Neck: Carotid bruit is not present.  Cardiovascular: Normal rate, regular rhythm and normal heart sounds.   Pulmonary/Chest: Effort normal. No respiratory distress. She has decreased breath sounds.  Musculoskeletal: Normal range of motion. She exhibits no edema.  Neurological: She is alert and oriented to person, place, and time.  Skin: Skin is warm and dry. No rash noted.  Psychiatric: She has a normal mood and affect. Her speech is normal and behavior is normal. Thought content normal.  Nursing note and vitals reviewed.   BP 160/90 mmHg  Pulse 84  Resp 16  Ht  (1.575 m)  Wt 147 lb (66.679 kg)  BMI 26.88 kg/m2  SpO2 96%  Assessment and Plan: 1. Migraine without aura and responsive to treatment - butalbital-acetaminophen-caffeine (FIORICET, ESGIC) 50-325-40 MG tablet; Take 1 tablet by mouth 3 (three) times daily as needed for headache.  Dispense: 270 tablet; Refill: 1 - promethazine (PHENERGAN) 25 MG tablet; Take 1 tablet (25 mg total) by mouth 4 (four) times daily as needed.  Dispense: 30 tablet; Refill: 5  2. Essential hypertension Only fair control - pt will monitor and call  if persistently elevated - amLODipine (NORVASC) 5 MG tablet; Take 1 tablet (5 mg total) by mouth daily.  Dispense: 90 tablet; Refill: 1  3. Chronic peptic ulcer with hemorrhage but without obstruction On PPI  4. History of colon polyps On recent colonoscopy - followed by GI  5. Pulmonary emphysema, unspecified emphysema type (HCC) - albuterol (PROVENTIL HFA;VENTOLIN HFA) 108 (90 Base) MCG/ACT inhaler; Inhale 2 puffs into the lungs every 6 (six) hours as needed for wheezing or shortness of breath.  Dispense: 1 Inhaler; Refill: 5 - Fluticasone-Salmeterol (ADVAIR DISKUS) 250-50 MCG/DOSE AEPB; Inhale 1 puff into the lungs 2 (two) times daily.  Dispense: 60 each; Refill: 5  6. Muscle spasms of neck - tiZANidine (ZANAFLEX) 4 MG capsule; Take 1 capsule (4 mg total) by mouth 3 (three) times daily.  Dispense: 270 capsule; Refill: 1  7. Breast cancer screening - MM DIGITAL SCREENING BILATERAL; Future  8. Ischemic colitis (HCC) Now asymptomatic - continue PPI Follow up with GI  Bari Edward, MD [facility]  11/10/2015

## 2015-11-10 NOTE — Patient Instructions (Signed)
DASH Eating Plan  DASH stands for "Dietary Approaches to Stop Hypertension." The DASH eating plan is a healthy eating plan that has been shown to reduce high blood pressure (hypertension). Additional health benefits may include reducing the risk of type 2 diabetes mellitus, heart disease, and stroke. The DASH eating plan may also help with weight loss.  WHAT DO I NEED TO KNOW ABOUT THE DASH EATING PLAN?  For the DASH eating plan, you will follow these general guidelines:  · Choose foods with a percent daily value for sodium of less than 5% (as listed on the food label).  · Use salt-free seasonings or herbs instead of table salt or sea salt.  · Check with your health care provider or pharmacist before using salt substitutes.  · Eat lower-sodium products, often labeled as "lower sodium" or "no salt added."  · Eat fresh foods.  · Eat more vegetables, fruits, and low-fat dairy products.  · Choose whole grains. Look for the word "whole" as the first word in the ingredient list.  · Choose fish and skinless chicken or turkey more often than red meat. Limit fish, poultry, and meat to 6 oz (170 g) each day.  · Limit sweets, desserts, sugars, and sugary drinks.  · Choose heart-healthy fats.  · Limit cheese to 1 oz (28 g) per day.  · Eat more home-cooked food and less restaurant, buffet, and fast food.  · Limit fried foods.  · Cook foods using methods other than frying.  · Limit canned vegetables. If you do use them, rinse them well to decrease the sodium.  · When eating at a restaurant, ask that your food be prepared with less salt, or no salt if possible.  WHAT FOODS CAN I EAT?  Seek help from a dietitian for individual calorie needs.  Grains  Whole grain or whole wheat bread. Brown rice. Whole grain or whole wheat pasta. Quinoa, bulgur, and whole grain cereals. Low-sodium cereals. Corn or whole wheat flour tortillas. Whole grain cornbread. Whole grain crackers. Low-sodium crackers.  Vegetables  Fresh or frozen vegetables  (raw, steamed, roasted, or grilled). Low-sodium or reduced-sodium tomato and vegetable juices. Low-sodium or reduced-sodium tomato sauce and paste. Low-sodium or reduced-sodium canned vegetables.   Fruits  All fresh, canned (in natural juice), or frozen fruits.  Meat and Other Protein Products  Ground beef (85% or leaner), grass-fed beef, or beef trimmed of fat. Skinless chicken or turkey. Ground chicken or turkey. Pork trimmed of fat. All fish and seafood. Eggs. Dried beans, peas, or lentils. Unsalted nuts and seeds. Unsalted canned beans.  Dairy  Low-fat dairy products, such as skim or 1% milk, 2% or reduced-fat cheeses, low-fat ricotta or cottage cheese, or plain low-fat yogurt. Low-sodium or reduced-sodium cheeses.  Fats and Oils  Tub margarines without trans fats. Light or reduced-fat mayonnaise and salad dressings (reduced sodium). Avocado. Safflower, olive, or canola oils. Natural peanut or almond butter.  Other  Unsalted popcorn and pretzels.  The items listed above may not be a complete list of recommended foods or beverages. Contact your dietitian for more options.  WHAT FOODS ARE NOT RECOMMENDED?  Grains  White bread. White pasta. White rice. Refined cornbread. Bagels and croissants. Crackers that contain trans fat.  Vegetables  Creamed or fried vegetables. Vegetables in a cheese sauce. Regular canned vegetables. Regular canned tomato sauce and paste. Regular tomato and vegetable juices.  Fruits  Dried fruits. Canned fruit in light or heavy syrup. Fruit juice.  Meat and Other Protein   Products  Fatty cuts of meat. Ribs, chicken wings, bacon, sausage, bologna, salami, chitterlings, fatback, hot dogs, bratwurst, and packaged luncheon meats. Salted nuts and seeds. Canned beans with salt.  Dairy  Whole or 2% milk, cream, half-and-half, and cream cheese. Whole-fat or sweetened yogurt. Full-fat cheeses or blue cheese. Nondairy creamers and whipped toppings. Processed cheese, cheese spreads, or cheese  curds.  Condiments  Onion and garlic salt, seasoned salt, table salt, and sea salt. Canned and packaged gravies. Worcestershire sauce. Tartar sauce. Barbecue sauce. Teriyaki sauce. Soy sauce, including reduced sodium. Steak sauce. Fish sauce. Oyster sauce. Cocktail sauce. Horseradish. Ketchup and mustard. Meat flavorings and tenderizers. Bouillon cubes. Hot sauce. Tabasco sauce. Marinades. Taco seasonings. Relishes.  Fats and Oils  Butter, stick margarine, lard, shortening, ghee, and bacon fat. Coconut, palm kernel, or palm oils. Regular salad dressings.  Other  Pickles and olives. Salted popcorn and pretzels.  The items listed above may not be a complete list of foods and beverages to avoid. Contact your dietitian for more information.  WHERE CAN I FIND MORE INFORMATION?  National Heart, Lung, and Blood Institute: www.nhlbi.nih.gov/health/health-topics/topics/dash/     This information is not intended to replace advice given to you by your health care provider. Make sure you discuss any questions you have with your health care provider.     Document Released: 05/16/2011 Document Revised: 06/17/2014 Document Reviewed: 03/31/2013  Elsevier Interactive Patient Education ©2016 Elsevier Inc.

## 2015-11-13 ENCOUNTER — Other Ambulatory Visit: Payer: Self-pay | Admitting: Internal Medicine

## 2015-11-13 MED ORDER — BUDESONIDE-FORMOTEROL FUMARATE 160-4.5 MCG/ACT IN AERO: 2.0000 | INHALATION_SPRAY | Freq: Two times a day (BID) | RESPIRATORY_TRACT | Status: DC

## 2015-11-14 ENCOUNTER — Telehealth: Payer: Self-pay

## 2015-11-14 ENCOUNTER — Ambulatory Visit: Payer: Self-pay | Admitting: Internal Medicine

## 2015-11-14 ENCOUNTER — Other Ambulatory Visit: Payer: Self-pay | Admitting: Internal Medicine

## 2015-11-14 MED ORDER — MOMETASONE FURO-FORMOTEROL FUM 200-5 MCG/ACT IN AERO: 2.0000 | INHALATION_SPRAY | Freq: Two times a day (BID) | RESPIRATORY_TRACT | Status: AC

## 2015-11-14 NOTE — Telephone Encounter (Signed)
Insurance not covering Advair Diskus but will cover Dulera. Can this be changed?

## 2016-03-27 ENCOUNTER — Other Ambulatory Visit: Payer: Self-pay | Admitting: Internal Medicine

## 2016-03-27 DIAGNOSIS — G43009 Migraine without aura, not intractable, without status migrainosus: Secondary | ICD-10-CM

## 2016-04-26 ENCOUNTER — Ambulatory Visit (INDEPENDENT_AMBULATORY_CARE_PROVIDER_SITE_OTHER): Payer: Managed Care, Other (non HMO) | Admitting: Internal Medicine

## 2016-04-26 ENCOUNTER — Encounter: Payer: Self-pay | Admitting: Internal Medicine

## 2016-04-26 VITALS — BP 142/90 | HR 110 | Temp 99.1°F | Resp 16 | Ht 62.0 in | Wt 135.0 lb

## 2016-04-26 DIAGNOSIS — J438 Other emphysema: Secondary | ICD-10-CM

## 2016-04-26 DIAGNOSIS — J40 Bronchitis, not specified as acute or chronic: Secondary | ICD-10-CM | POA: Diagnosis not present

## 2016-04-26 DIAGNOSIS — F411 Generalized anxiety disorder: Secondary | ICD-10-CM | POA: Diagnosis not present

## 2016-04-26 DIAGNOSIS — J439 Emphysema, unspecified: Secondary | ICD-10-CM | POA: Diagnosis not present

## 2016-04-26 DIAGNOSIS — G43009 Migraine without aura, not intractable, without status migrainosus: Secondary | ICD-10-CM

## 2016-04-26 DIAGNOSIS — I10 Essential (primary) hypertension: Secondary | ICD-10-CM

## 2016-04-26 DIAGNOSIS — M62838 Other muscle spasm: Secondary | ICD-10-CM | POA: Diagnosis not present

## 2016-04-26 MED ORDER — TIZANIDINE HCL 4 MG PO CAPS: 4.0000 mg | ORAL_CAPSULE | Freq: Three times a day (TID) | ORAL | 1 refills | Status: DC

## 2016-04-26 MED ORDER — CEFDINIR 300 MG PO CAPS: 300.0000 mg | ORAL_CAPSULE | Freq: Two times a day (BID) | ORAL | 0 refills | Status: DC

## 2016-04-26 MED ORDER — ALBUTEROL SULFATE HFA 108 (90 BASE) MCG/ACT IN AERS: 2.0000 | INHALATION_SPRAY | Freq: Four times a day (QID) | RESPIRATORY_TRACT | 5 refills | Status: DC | PRN

## 2016-04-26 MED ORDER — BUTALBITAL-APAP-CAFFEINE 50-325-40 MG PO TABS: 1.0000 | ORAL_TABLET | Freq: Three times a day (TID) | ORAL | 1 refills | Status: DC | PRN

## 2016-04-26 NOTE — Progress Notes (Signed)
Date:  04/26/2016   Name:  Kathleen Woodard   DOB:  06/10/1961   MRN:  [id number]   Chief Complaint: Cough (Refill Puffer has had flu al,ost 2 weeks. Fever cough and bodyaches. ); Migraine (refilla); and Back Pain (refill Tramadol) Cough  This is a new problem. The current episode started in the past 7 days. The cough is productive of sputum. Associated symptoms include headaches and wheezing. Pertinent negatives include no chest pain, fever or shortness of breath. She has tried a beta-agonist inhaler for the symptoms. The treatment provided mild relief. Her past medical history is significant for COPD.  Migraine   This is a recurrent problem. The problem occurs daily. The problem has been unchanged. Associated symptoms include back pain and coughing. Pertinent negatives include no abdominal pain, fever, numbness or tinnitus. Her past medical history is significant for hypertension.  Back Pain  This is a recurrent problem. The problem occurs intermittently. The problem is unchanged. Associated symptoms include headaches. Pertinent negatives include no abdominal pain, chest pain, dysuria, fever or numbness.  Hypertension  This is a chronic problem. The problem is unchanged. The problem is controlled. Associated symptoms include headaches. Pertinent negatives include no chest pain, palpitations or shortness of breath.      Review of Systems  Constitutional: Negative for appetite change, fatigue, fever and unexpected weight change.  HENT: Negative for tinnitus and trouble swallowing.   Eyes: Negative for visual disturbance.  Respiratory: Positive for cough and wheezing. Negative for chest tightness and shortness of breath.   Cardiovascular: Negative for chest pain, palpitations and leg swelling.  Gastrointestinal: Positive for abdominal distention. Negative for abdominal pain.       UC or ischemic colitis [place].  Endocrine: Negative for polydipsia and polyuria.    Genitourinary: Negative for dysuria and hematuria.  Musculoskeletal: Positive for back pain. Negative for arthralgias.  Neurological: Positive for headaches. Negative for tremors and numbness.  Psychiatric/Behavioral: Negative for dysphoric mood and sleep disturbance. The patient is nervous/anxious.     Patient Active Problem List   Diagnosis Date Noted  . Ischemic colitis (HCC) 11/10/2015  . Essential hypertension 05/11/2015  . Migraine without aura and responsive to treatment 03/22/2015  . Chronic peptic ulcer with hemorrhage but without obstruction 03/22/2015  . Incontinence 03/22/2015  . Anxiety, generalized 03/22/2015  . History of colon polyps 03/22/2015  . Idiopathic insomnia 03/22/2015  . Chronic obstructive pulmonary emphysema (HCC) 03/22/2015  . Muscle spasms of neck 03/22/2015    Prior to Admission medications   Medication Sig Start Date End Date Taking? Authorizing Provider  albuterol (PROVENTIL HFA;VENTOLIN HFA) 108 (90 Base) MCG/ACT inhaler Inhale 2 puffs into the lungs every 6 (six) hours as needed for wheezing or shortness of breath. 11/10/15   Reubin MilanLaura H Berglund, MD  albuterol (PROVENTIL) (2.5 MG/3ML) 0.083% nebulizer solution Inhale 3 mLs (2.5 mg total) into the lungs every 4 (four) hours as needed for wheezing or shortness of breath. 05/02/15   Reubin MilanLaura H Berglund, MD  ALPRAZolam Prudy Feeler(XANAX) 0.5 MG tablet Take 1 tablet by mouth 3 (three) times daily. 04/17/16   Historical Provider, MD  amLODipine (NORVASC) 5 MG tablet Take 1 tablet (5 mg total) by mouth daily. 11/10/15   Reubin MilanLaura H Berglund, MD  ASPIRIN LOW DOSE 81 MG chewable tablet  08/14/15   Historical Provider, MD  bisacodyl (DULCOLAX) 5 MG EC tablet Take by mouth.    Historical Provider, MD  butalbital-acetaminophen-caffeine (FIORICET, ESGIC) [id number] MG  tablet Take 1 tablet by mouth 3 (three) times daily as needed for headache. 11/10/15   Reubin Milan, MD  mometasone-formoterol [facility]) 200-5 MCG/ACT AERO Inhale 2 puffs into  the lungs 2 (two) times daily. 11/14/15   Reubin Milan, MD  omeprazole (PRILOSEC) 40 MG capsule Take 1 capsule by mouth daily. 10/13/15   Historical Provider, MD  promethazine (PHENERGAN) 25 MG tablet TAKE ONE TABLET BY MOUTH 4 TIMES DAILY AS NEEDED 03/28/16   Reubin Milan, MD  tiZANidine (ZANAFLEX) 4 MG capsule Take 1 capsule (4 mg total) by mouth 3 (three) times daily. 11/10/15   Reubin Milan, MD  venlafaxine XR (EFFEXOR-XR) 75 MG 24 hr capsule  11/13/15   Historical Provider, MD    Allergies  Allergen Reactions  . Triptans Shortness Of Breath    Imitrex  . Ciprofloxacin Hives  . Codeine Diarrhea, Nausea Only and Nausea And Vomiting  . Sumatriptan Succinate Other (See Comments)  . Zolpidem Other (See Comments)    Confusion    Past Surgical History:  Procedure Laterality Date  . CHOLECYSTECTOMY    . COLONOSCOPY  2013   hyperplastic polyp  . ESOPHAGOGASTRODUODENOSCOPY  2013   gastritis  . LUMBAR FUSION    . TOTAL ABDOMINAL HYSTERECTOMY  1985  . Vocal cord polyp resection      Social History  Substance Use Topics  . Smoking status: Former Games developer  . Smokeless tobacco: Not on file  . Alcohol use No     Medication list has been reviewed and updated.   Physical Exam  Constitutional: She is oriented to person, place, and time. She appears well-developed. No distress.  HENT:  Head: Normocephalic and atraumatic.  Neck: Normal range of motion. Neck supple. No thyromegaly present.  Cardiovascular: Normal rate, regular rhythm and normal heart sounds.   Pulmonary/Chest: Effort normal. No respiratory distress. She has wheezes. She has no rales.  Musculoskeletal: She exhibits no edema.       Thoracic back: She exhibits spasm.       Lumbar back: She exhibits spasm.  Neurological: She is alert and oriented to person, place, and time.  Skin: Skin is warm and dry. No rash noted.  Psychiatric: She has a normal mood and affect. Her behavior is normal. Thought content normal.    Nursing note and vitals reviewed.   BP (!) 142/90   Pulse (!) 110   Temp 99.1 F (37.3 C)   Resp 16   Ht 5\' 2"  (1.575 m)   Wt 135 lb (61.2 kg)   SpO2 95%   BMI 24.69 kg/m   Assessment and Plan: 1. Essential hypertension Fair control - continue current therapy  2. Migraine without aura and responsive to treatment stable - butalbital-acetaminophen-caffeine (FIORICET, ESGIC) 50-325-40 MG tablet; Take 1 tablet by mouth 3 (three) times daily as needed for headache.  Dispense: 270 tablet; Refill: 1  3. Other emphysema (HCC) Continue inhalers  4. Anxiety, generalized On medication from GI - Effexor and Xanax  5. Muscle spasms of neck - tiZANidine (ZANAFLEX) 4 MG capsule; Take 1 capsule (4 mg total) by mouth 3 (three) times daily.  Dispense: 270 capsule; Refill: 1  6. Pulmonary emphysema, unspecified emphysema type (HCC) - albuterol (PROVENTIL HFA;VENTOLIN HFA) 108 (90 Base) MCG/ACT inhaler; Inhale 2 puffs into the lungs every 6 (six) hours as needed for wheezing or shortness of breath.  Dispense: 1 Inhaler; Refill: 5  7. Bronchitis - cefdinir (OMNICEF) 300 MG capsule; Take 1 capsule (300  mg total) by mouth 2 (two) times daily.  Dispense: 20 capsule; Refill: 0   Bari EdwardLaura Berglund, MD [facility]  04/26/2016

## 2016-05-24 ENCOUNTER — Other Ambulatory Visit: Payer: Self-pay | Admitting: Internal Medicine

## 2016-05-24 DIAGNOSIS — I10 Essential (primary) hypertension: Secondary | ICD-10-CM

## 2016-05-24 MED ORDER — AMLODIPINE BESYLATE 5 MG PO TABS: 5.0000 mg | ORAL_TABLET | Freq: Every day | ORAL | 1 refills | Status: DC

## 2016-05-24 MED ORDER — AMLODIPINE BESYLATE 5 MG PO TABS
5.0000 mg | ORAL_TABLET | Freq: Every day | ORAL | 1 refills | Status: DC
Start: 2016-05-24 — End: 2016-05-27

## 2016-05-27 ENCOUNTER — Other Ambulatory Visit: Payer: Self-pay | Admitting: Internal Medicine

## 2016-05-27 DIAGNOSIS — I10 Essential (primary) hypertension: Secondary | ICD-10-CM

## 2016-05-27 MED ORDER — AMLODIPINE BESYLATE 5 MG PO TABS: 5.0000 mg | ORAL_TABLET | Freq: Every day | ORAL | 1 refills | Status: DC

## 2016-05-29 ENCOUNTER — Other Ambulatory Visit: Payer: Self-pay | Admitting: Internal Medicine

## 2016-05-29 ENCOUNTER — Telehealth: Payer: Self-pay | Admitting: Internal Medicine

## 2016-05-29 NOTE — Telephone Encounter (Signed)
Pt called need cream medication for

## 2016-05-29 NOTE — Telephone Encounter (Signed)
Last given 3 years ago - Bactroban for impetigo.  Refill on this is not appropriate.  If she feels that she needs a prescription medication, she will need an appointment.

## 2016-05-29 NOTE — Telephone Encounter (Signed)
Pt called need some cream for cold sore or impetigo.

## 2016-05-30 NOTE — Telephone Encounter (Signed)
Please call patient

## 2016-05-30 NOTE — Telephone Encounter (Signed)
Called pt stated will make an appointment to get prescription.

## 2016-06-10 DIAGNOSIS — R4689 Other symptoms and signs involving appearance and behavior: Secondary | ICD-10-CM

## 2016-06-10 DIAGNOSIS — T50901A Poisoning by unspecified drugs, medicaments and biological substances, accidental (unintentional), initial encounter: Secondary | ICD-10-CM | POA: Insufficient documentation

## 2016-06-10 DIAGNOSIS — R4589 Other symptoms and signs involving emotional state: Secondary | ICD-10-CM | POA: Insufficient documentation

## 2016-06-11 DIAGNOSIS — F1410 Cocaine abuse, uncomplicated: Secondary | ICD-10-CM | POA: Insufficient documentation

## 2016-06-11 DIAGNOSIS — F603 Borderline personality disorder: Secondary | ICD-10-CM | POA: Insufficient documentation

## 2016-06-15 ENCOUNTER — Other Ambulatory Visit: Payer: Self-pay | Admitting: Internal Medicine

## 2016-06-15 DIAGNOSIS — Z720 Tobacco use: Secondary | ICD-10-CM | POA: Insufficient documentation

## 2016-11-20 ENCOUNTER — Emergency Department: Payer: BLUE CROSS/BLUE SHIELD

## 2016-11-20 ENCOUNTER — Emergency Department
Admission: EM | Admit: 2016-11-20 | Discharge: 2016-11-20 | Disposition: A | Discharge status: Home / Self Care | Payer: BLUE CROSS/BLUE SHIELD | Attending: Emergency Medicine | Admitting: Emergency Medicine

## 2016-11-20 ENCOUNTER — Encounter: Payer: Self-pay | Admitting: Emergency Medicine

## 2016-11-20 DIAGNOSIS — Z87891 Personal history of nicotine dependence: Secondary | ICD-10-CM | POA: Diagnosis not present

## 2016-11-20 DIAGNOSIS — Z7982 Long term (current) use of aspirin: Secondary | ICD-10-CM | POA: Diagnosis not present

## 2016-11-20 DIAGNOSIS — S0990XA Unspecified injury of head, initial encounter: Secondary | ICD-10-CM | POA: Insufficient documentation

## 2016-11-20 DIAGNOSIS — W19XXXA Unspecified fall, initial encounter: Secondary | ICD-10-CM

## 2016-11-20 DIAGNOSIS — I10 Essential (primary) hypertension: Secondary | ICD-10-CM | POA: Insufficient documentation

## 2016-11-20 DIAGNOSIS — Y929 Unspecified place or not applicable: Secondary | ICD-10-CM | POA: Diagnosis not present

## 2016-11-20 DIAGNOSIS — M545 Low back pain, unspecified: Secondary | ICD-10-CM

## 2016-11-20 DIAGNOSIS — R55 Syncope and collapse: Secondary | ICD-10-CM

## 2016-11-20 DIAGNOSIS — Y999 Unspecified external cause status: Secondary | ICD-10-CM | POA: Diagnosis not present

## 2016-11-20 DIAGNOSIS — J441 Chronic obstructive pulmonary disease with (acute) exacerbation: Secondary | ICD-10-CM | POA: Insufficient documentation

## 2016-11-20 DIAGNOSIS — Z7951 Long term (current) use of inhaled steroids: Secondary | ICD-10-CM | POA: Diagnosis not present

## 2016-11-20 DIAGNOSIS — F1910 Other psychoactive substance abuse, uncomplicated: Secondary | ICD-10-CM | POA: Insufficient documentation

## 2016-11-20 DIAGNOSIS — Y9301 Activity, walking, marching and hiking: Secondary | ICD-10-CM | POA: Insufficient documentation

## 2016-11-20 DIAGNOSIS — W109XXA Fall (on) (from) unspecified stairs and steps, initial encounter: Secondary | ICD-10-CM | POA: Insufficient documentation

## 2016-11-20 DIAGNOSIS — Z79899 Other long term (current) drug therapy: Secondary | ICD-10-CM | POA: Diagnosis not present

## 2016-11-20 HISTORY — DX: Chronic obstructive pulmonary disease, unspecified: J44.9

## 2016-11-20 LAB — URINE DRUG SCREEN, QUALITATIVE (ARMC ONLY)
AMPHETAMINES, UR SCREEN: NOT DETECTED
BENZODIAZEPINE, UR SCRN: POSITIVE — AB
Barbiturates, Ur Screen: NOT DETECTED
Cannabinoid 50 Ng, Ur ~~LOC~~: NOT DETECTED
Cocaine Metabolite,Ur ~~LOC~~: POSITIVE — AB
MDMA (Ecstasy)Ur Screen: NOT DETECTED
METHADONE SCREEN, URINE: NOT DETECTED
Opiate, Ur Screen: NOT DETECTED
Phencyclidine (PCP) Ur S: NOT DETECTED
Tricyclic, Ur Screen: NOT DETECTED

## 2016-11-20 LAB — BASIC METABOLIC PANEL
Anion gap: 3 — ABNORMAL LOW (ref 5–15)
BUN: 10 mg/dL (ref 6–20)
CHLORIDE: 107 mmol/L (ref 101–111)
CO2: 30 mmol/L (ref 22–32)
Calcium: 8.4 mg/dL — ABNORMAL LOW (ref 8.9–10.3)
Creatinine, Ser: 0.56 mg/dL (ref 0.44–1.00)
GFR calc Af Amer: >60 mL/min (ref >60)
GFR calc non Af Amer: >60 mL/min (ref >60)
GLUCOSE: 85 mg/dL (ref 65–99)
Potassium: 3.6 mmol/L (ref 3.5–5.1)
Sodium: 140 mmol/L (ref 135–145)

## 2016-11-20 LAB — URINALYSIS, COMPLETE (UACMP) WITH MICROSCOPIC
BACTERIA UA: NONE SEEN
Bilirubin Urine: NEGATIVE
Glucose, UA: NEGATIVE mg/dL
Hgb urine dipstick: NEGATIVE
KETONES UR: NEGATIVE mg/dL
Nitrite: NEGATIVE
PROTEIN: NEGATIVE mg/dL
Specific Gravity, Urine: 1.015 (ref 1.005–1.030)
pH: 6 (ref 5.0–8.0)

## 2016-11-20 LAB — CBC WITH DIFFERENTIAL/PLATELET
Basophils Absolute: 0 10*3/uL (ref 0–0.1)
Basophils Relative: 0 %
EOS PCT: 1 %
Eosinophils Absolute: 0.1 10*3/uL (ref 0–0.7)
HCT: 35.4 % (ref 35.0–47.0)
HEMOGLOBIN: 12.5 g/dL (ref 12.0–16.0)
LYMPHS ABS: 1.5 10*3/uL (ref 1.0–3.6)
LYMPHS PCT: 30 %
MCH: 30.2 pg (ref 26.0–34.0)
MCHC: 35.2 g/dL (ref 32.0–36.0)
MCV: 85.8 fL (ref 80.0–100.0)
Monocytes Absolute: 0.5 10*3/uL (ref 0.2–0.9)
Monocytes Relative: 10 %
Neutro Abs: 2.8 10*3/uL (ref 1.4–6.5)
Neutrophils Relative %: 59 %
PLATELETS: 120 10*3/uL — AB (ref 150–440)
RBC: 4.13 MIL/uL (ref 3.80–5.20)
RDW: 13.4 % (ref 11.5–14.5)
WBC: 4.9 10*3/uL (ref 3.6–11.0)

## 2016-11-20 LAB — TROPONIN I

## 2016-11-20 LAB — ETHANOL: Alcohol, Ethyl (B): <5 mg/dL (ref <5)

## 2016-11-20 MED ORDER — ALBUTEROL SULFATE HFA 108 (90 BASE) MCG/ACT IN AERS: 2.0000 | INHALATION_SPRAY | Freq: Four times a day (QID) | RESPIRATORY_TRACT | 2 refills | Status: DC | PRN

## 2016-11-20 MED ORDER — METHYLPREDNISOLONE SODIUM SUCC 125 MG IJ SOLR
125.0000 mg | Freq: Once | INTRAMUSCULAR | Status: AC
  Administered 2016-11-20: 125 mg via INTRAVENOUS
  Filled 2016-11-20: qty 2

## 2016-11-20 MED ORDER — IPRATROPIUM-ALBUTEROL 0.5-2.5 (3) MG/3ML IN SOLN
3.0000 mL | Freq: Once | RESPIRATORY_TRACT | Status: AC
Start: 2016-11-20 — End: 2016-11-20
  Administered 2016-11-20: 3 mL via RESPIRATORY_TRACT
  Filled 2016-11-20: qty 3

## 2016-11-20 MED ORDER — IPRATROPIUM-ALBUTEROL 0.5-2.5 (3) MG/3ML IN SOLN
3.0000 mL | Freq: Once | RESPIRATORY_TRACT | Status: AC
  Administered 2016-11-20: 3 mL via RESPIRATORY_TRACT
  Filled 2016-11-20: qty 3

## 2016-11-20 MED ORDER — FENTANYL CITRATE (PF) 100 MCG/2ML IJ SOLN
50.0000 ug | Freq: Once | INTRAMUSCULAR | Status: AC
  Administered 2016-11-20: 50 ug via INTRAVENOUS
  Filled 2016-11-20: qty 2

## 2016-11-20 MED ORDER — CYCLOBENZAPRINE HCL 10 MG PO TABS: 10.0000 mg | ORAL_TABLET | Freq: Every day | ORAL | 0 refills | Status: DC

## 2016-11-20 NOTE — ED Notes (Signed)
AAOx3.  Ambulates with easy and steady gait.  Offered wheelchair to discharge patient, patient refused and wanted to walk out.  Accompanied patient out to car.  Patient tolerated well.

## 2016-11-20 NOTE — ED Notes (Signed)
Patient transported to CT 

## 2016-11-20 NOTE — ED Notes (Signed)
Ambulated patient on room air.  Tolerated well.  Sats 90-93%.  No SOB/ DOE

## 2016-11-20 NOTE — ED Triage Notes (Addendum)
Pt arrived via EMS from home with husband. [facility] [place].  Pt states she did lose consciousness when she fell.  Pt states she woke up on the ground and states she does not know how she got there. Pt states she has a knot on her head. Pt c/o frequent back spasms. Pt does not c/o back pain however, EMS placed C-collar on patient. Pt has back brace on as well. Has hx of chronic back problems.  Pt states she is supposed to be on oxygen at home, but cannot afford it. Pt received 100mcg Fentanyl and 4mg  Zofran with EMS. Took 2 Tylenol around 930.

## 2016-11-20 NOTE — ED Provider Notes (Signed)
[facility] Emergency Department Provider Note  ____________________________________________  Time seen: Approximately 10:56 AM  I have reviewed the triage vital signs and the nursing notes.   HISTORY  Chief Complaint Loss of Consciousness and Fall   HPI Kathleen Woodard is a 56 y.o. female with a history of COPD, borderline personality disorder, cocaine abuse, depression, IV drug use, hypertension who presents for evaluation of fall. Patient reports that she was coming down stairs yesterday when she fell. She does not know if she tripped or she felt dizzy before falling. She hit her back on the ground and her head. She endorses LOC. Last night when she went to the bathroom she felt dizzy and had a syncopal episode as well. She denies hurting herself during that episode. She is complaining of pain that is located in her lower back and left buttock region radiating down her left leg, severe and constant since yesterday. She denies headache, neck pain, chest pain, abdominal pain, extremity pain. She denies alcohol and drug use however she tells me she has never used alcohol or drugs. There is clear documentation in her chart that patient has been an IV drug user and also cocaine user.Patient recently admitted at [facility] in January 2018 for drug overdose where she admitted IV drug use, cocaine abuse, and hoarding of prescription controlled medication.  Past Medical History:  Diagnosis Date  . COPD (chronic obstructive pulmonary disease) [facility])     Patient Active Problem List   Diagnosis Date Noted  . Tobacco abuse disorder 06/15/2016  . Cocaine use disorder, mild, abuse 06/11/2016  . Borderline personality disorder 06/11/2016  . Suicidal behavior 06/10/2016  . Overdose 06/10/2016  . Ischemic colitis (HCC) 11/10/2015  . Essential hypertension 05/11/2015  . Migraine without aura and responsive to treatment 03/22/2015  . Chronic peptic ulcer with hemorrhage but  without obstruction 03/22/2015  . Incontinence 03/22/2015  . Anxiety, generalized 03/22/2015  . History of colon polyps 03/22/2015  . Idiopathic insomnia 03/22/2015  . Chronic obstructive pulmonary emphysema (HCC) 03/22/2015  . Muscle spasms of neck 03/22/2015    Past Surgical History:  Procedure Laterality Date  . CHOLECYSTECTOMY    . COLONOSCOPY  2013   hyperplastic polyp  . ESOPHAGOGASTRODUODENOSCOPY  2013   gastritis  . LUMBAR FUSION    . TOTAL ABDOMINAL HYSTERECTOMY  1985  . Vocal cord polyp resection      Prior to Admission medications   Medication Sig Start Date End Date Taking? Authorizing Provider  albuterol (PROVENTIL HFA;VENTOLIN HFA) 108 (90 Base) MCG/ACT inhaler Inhale 2 puffs into the lungs every 6 (six) hours as needed for wheezing or shortness of breath. 04/26/16   Reubin Milan, MD  albuterol (PROVENTIL HFA;VENTOLIN HFA) 108 (90 Base) MCG/ACT inhaler Inhale 2 puffs into the lungs every 6 (six) hours as needed for wheezing or shortness of breath. 11/20/16   Nita Sickle, MD  albuterol (PROVENTIL) (2.5 MG/3ML) 0.083% nebulizer solution Inhale 3 mLs (2.5 mg total) into the lungs every 4 (four) hours as needed for wheezing or shortness of breath. 05/02/15   Reubin Milan, MD  ALPRAZolam Prudy Feeler) 0.5 MG tablet Take 0.5 mg by mouth 2 (two) times daily as needed.    [provider]  amLODipine (NORVASC) 5 MG tablet Take 1 tablet (5 mg total) by mouth daily. 05/27/16   Reubin Milan, MD  ASPIRIN LOW DOSE 81 MG chewable tablet  08/14/15   [provider]  bisacodyl (DULCOLAX) 5 MG EC  tablet Take by mouth.    [provider]  butalbital-acetaminophen-caffeine (FIORICET, ESGIC) 50-325-40 MG tablet Take 1 tablet by mouth 3 (three) times daily as needed for headache. 04/26/16   Reubin Milan, MD  cyclobenzaprine (FLEXERIL) 10 MG tablet Take 1 tablet (10 mg total) by mouth at bedtime. 11/20/16   Nita Sickle, MD  metoprolol  succinate (TOPROL-XL) 50 MG 24 hr tablet Take 50 mg by mouth daily. 06/15/16 07/15/16  [provider]  mometasone-formoterol (DULERA) 200-5 MCG/ACT AERO Inhale 2 puffs into the lungs 2 (two) times daily. 11/14/15   Reubin Milan, MD  omeprazole (PRILOSEC) 40 MG capsule Take 1 capsule by mouth daily. 10/13/15   [provider]  promethazine (PHENERGAN) 25 MG tablet TAKE ONE TABLET BY MOUTH 4 TIMES DAILY AS NEEDED 03/28/16   Reubin Milan, MD  tiZANidine (ZANAFLEX) 4 MG capsule Take 1 capsule (4 mg total) by mouth 3 (three) times daily. 04/26/16   Reubin Milan, MD  venlafaxine XR (EFFEXOR-XR) 75 MG 24 hr capsule  11/13/15   [provider]    Allergies Triptans; Ciprofloxacin; Codeine; Sumatriptan succinate; and Zolpidem  Family History  Problem Relation Age of Onset  . Stroke Mother   . Cancer Mother   . Heart failure Father     Social History Social History  Substance Use Topics  . Smoking status: Former Games developer  . Smokeless tobacco: Never Used  . Alcohol use No    Review of Systems Constitutional: Negative for fever. + syncope Eyes: Negative for visual changes. ENT: Negative for facial injury or neck injury Cardiovascular: Negative for chest injury. Respiratory: Negative for shortness of breath. Negative for chest wall injury. Gastrointestinal: Negative for abdominal pain or injury. Genitourinary: Negative for dysuria. Musculoskeletal: + back pain. negative for arm or leg pain. Skin: Negative for laceration/abrasions. Neurological: + head injury.   ____________________________________________   PHYSICAL EXAM:  VITAL SIGNS: ED Triage Vitals  Enc Vitals Group     BP 11/20/16 1022 (!) 120/103     Pulse Rate 11/20/16 1022 76     Resp 11/20/16 1022 20     Temp 11/20/16 1022 98.6 F (37 C)     Temp Source 11/20/16 1022 Oral     SpO2 11/20/16 1022 (!) 88 %     Weight 11/20/16 1023 135 lb (61.2 kg)     Height 11/20/16 1023 5\' 3"  (1.6 m)      Head Circumference --      Peak Flow --      Pain Score 11/20/16 1022 10     Pain Loc --      Pain Edu? --      Excl. in GC? --     Constitutional: Alert and oriented, looks intoxicated and slurring speech. No acute distress.  HEENT Head: Normocephalic and atraumatic. Face: No facial bony tenderness. Stable midface Ears: No hemotympanum bilaterally. No Battle sign Eyes: No eye injury. PERRL. No raccoon eyes Nose: Nontender. No epistaxis. No rhinorrhea Mouth/Throat: Mucous membranes are moist. No oropharyngeal blood. No dental injury. Airway patent without stridor. Normal voice. Neck: C-collar in place. No midline c-spine tenderness.  Cardiovascular: Normal rate, regular rhythm. Normal and symmetric distal pulses are present in all extremities. Pulmonary/Chest: Patient is hypoxic on room air, she has diffuse expiratory wheezes and decreased air movement bilaterally. No crepitus.  Abdominal: Soft, nontender, non distended. Musculoskeletal: Nontender with normal full range of motion in all extremities. No deformities. Lumbar spine midline ttp with no  abrasions or deformities, ttp over the sciatic notch on the L. No thoracic midline spinal tenderness. Pelvis is stable. Skin: Skin is warm, dry and intact. No abrasions or contutions. Psychiatric: Speech slurred Neurological: Face symmetric. Moves all extremities to command. No gross focal neurologic deficits are appreciated.  Glascow Coma Score: 4 - Opens eyes on own 6 - Follows simple motor commands 5 - Alert and oriented GCS: 15   ____________________________________________   LABS (all labs ordered are listed, but only abnormal results are displayed)  Labs Reviewed  CBC WITH DIFFERENTIAL/PLATELET - Abnormal; Notable for the following:       Result Value   Platelets 120 (*)    All other components within normal limits  BASIC METABOLIC PANEL - Abnormal; Notable for the following:    Calcium 8.4 (*)    Anion gap 3 (*)    All  other components within normal limits  URINALYSIS, COMPLETE (UACMP) WITH MICROSCOPIC - Abnormal; Notable for the following:    Color, Urine YELLOW (*)    APPearance CLEAR (*)    Leukocytes, UA TRACE (*)    Squamous Epithelial / LPF 0-5 (*)    All other components within normal limits  URINE DRUG SCREEN, QUALITATIVE ([facility] ONLY) - Abnormal; Notable for the following:    Cocaine Metabolite,Ur [place] POSITIVE (*)    Benzodiazepine, Ur Scrn POSITIVE (*)    All other components within normal limits  TROPONIN I  ETHANOL  CBG MONITORING, ED   ____________________________________________  EKG  ED ECG REPORT I, Nita Sickle, the attending physician, personally viewed and interpreted this ECG.   Normal sinus rhythm, rate of 69, normal intervals, normal axis, no ST elevations or depressions.  ____________________________________________  RADIOLOGY  Head CT: No acute abnormality noted  XR pelvis: Normal left hip.  XR lumbar: No acute finding. Degenerative disease appearing worst at L2-3. Status post L4-S1 fusion. Atherosclerosis. ____________________________________________   PROCEDURES  Procedure(s) performed: None Procedures Critical Care performed:  None ____________________________________________   INITIAL IMPRESSION / ASSESSMENT AND PLAN / ED COURSE  56 y.o. female with a history of COPD, borderline personality disorder, cocaine abuse, depression, IV drug use, hypertension who presents for evaluation of fall/ syncope with head trauma and back trauma. Patient looks intoxicated with slurred speech, there is no obvious injuries on exam. She does have tenderness to palpation in her lower lumbar spine. Patient has a well-healed old surgical scar from prior back surgery and has a history of chronic back pain. Unclear if this pain that she is experiencing right now is acute or exacerbation of her chronic pain after her fall. She is also tender to palpation on her sciatic notch on  the left with pain radiating down her leg concerning for sciatica. We'll get x-rays of her lumbar spine and her pelvis. CT head has been ordered since patient had a syncopal episode after this fall with head trauma. Patient is not on any blood thinners. She has a GCS of 15. Will check labs, tox screen, alcohol level.  Clinical Course as of Nov 21 1250  Wed Nov 20, 2016  1247 Drug screen positive for cocaine and benzos. No other acute findings on lab and imaging studies. Patient is ambulating with sats between 90 and 93% on room air which is her baseline. Her lungs sound much improved and she is moving good air. Patient looks more sober and has ambulated without pain or instability. She is going to be discharged home at this time.  [CV]  Clinical Course User Index [CV] Don PerkingVeronese, [place], MD   Patient's husband to pick her up.  Pertinent labs & imaging results that were available during my care of the patient were reviewed by me and considered in my medical decision making (see chart for details).    ____________________________________________   FINAL CLINICAL IMPRESSION(S) / ED DIAGNOSES  Final diagnoses:  Fall, initial encounter  Polysubstance abuse  Injury of head, initial encounter  Syncope and collapse  Left-sided low back pain without sciatica, unspecified chronicity  COPD exacerbation (HCC)      NEW MEDICATIONS STARTED DURING THIS VISIT:  New Prescriptions   ALBUTEROL (PROVENTIL HFA;VENTOLIN HFA) 108 (90 BASE) MCG/ACT INHALER    Inhale 2 puffs into the lungs every 6 (six) hours as needed for wheezing or shortness of breath.   CYCLOBENZAPRINE (FLEXERIL) 10 MG TABLET    Take 1 tablet (10 mg total) by mouth at bedtime.     Note:  This document was prepared using Dragon voice recognition software and may include unintentional dictation errors.    Nita SickleVeronese, [place], MD 11/20/16 [phone]

## 2016-11-20 NOTE — Discharge Instructions (Signed)

## 2016-11-26 ENCOUNTER — Other Ambulatory Visit: Payer: Self-pay | Admitting: Internal Medicine

## 2016-11-26 ENCOUNTER — Other Ambulatory Visit: Payer: Self-pay

## 2016-11-26 ENCOUNTER — Telehealth: Payer: Self-pay

## 2016-11-26 ENCOUNTER — Telehealth: Payer: Self-pay | Admitting: Internal Medicine

## 2016-11-26 DIAGNOSIS — I10 Essential (primary) hypertension: Secondary | ICD-10-CM

## 2016-11-26 MED ORDER — AMLODIPINE BESYLATE 5 MG PO TABS: 5.0000 mg | ORAL_TABLET | Freq: Every day | ORAL | 0 refills | Status: DC

## 2016-11-26 NOTE — Telephone Encounter (Signed)
Pt needs medication for two weeks until she can come in on 12/09/2016 for her med refill office visit. She said its for her blood pressure and head & back.

## 2016-11-26 NOTE — Telephone Encounter (Signed)
Pt called requesting to get refills on: - Amlodipine - Fiorcet - & Tizanidine.  Her next appt is July 2nd. Please Advise.

## 2016-12-09 ENCOUNTER — Ambulatory Visit: Payer: Self-pay | Admitting: Internal Medicine

## 2016-12-13 ENCOUNTER — Telehealth: Payer: Self-pay | Admitting: Internal Medicine

## 2016-12-13 NOTE — Telephone Encounter (Signed)
Pt called stating she wanted to cancel her July 11th appt with Dr Judithann GravesBerglund.  When asked if she wanted to reschedule she stated no but wanted to leave a message for Dr Judithann GravesBerglund.  She stated "Dr Judithann GravesBerglund pre-judged her without calling her first" & then hung up.  Spoke to Dr Judithann GravesBerglund about the pt & informed her that the pt originally had an appt on 12/09/2016 but then rescheduled to 12/18/2016.  Dr Judithann GravesBerglund stated from the previous ER note that the pt overdosed from the medication she is wanting to have filled.  Dr B was not comfortable in refilling that medication.  This was also told to the pt by the CMA.  Tried to call pt back after being hung up on but she sent the call to voicemail.

## 2016-12-15 ENCOUNTER — Encounter: Payer: Self-pay | Admitting: *Deleted

## 2016-12-15 ENCOUNTER — Emergency Department: Payer: BLUE CROSS/BLUE SHIELD

## 2016-12-15 ENCOUNTER — Emergency Department
Admission: EM | Admit: 2016-12-15 | Discharge: 2016-12-15 | Disposition: A | Discharge status: Home / Self Care | Payer: BLUE CROSS/BLUE SHIELD | Attending: Emergency Medicine | Admitting: Emergency Medicine

## 2016-12-15 ENCOUNTER — Other Ambulatory Visit: Payer: Self-pay

## 2016-12-15 DIAGNOSIS — R0602 Shortness of breath: Secondary | ICD-10-CM | POA: Diagnosis present

## 2016-12-15 DIAGNOSIS — Z87891 Personal history of nicotine dependence: Secondary | ICD-10-CM | POA: Diagnosis not present

## 2016-12-15 DIAGNOSIS — Z7951 Long term (current) use of inhaled steroids: Secondary | ICD-10-CM | POA: Insufficient documentation

## 2016-12-15 DIAGNOSIS — Z79899 Other long term (current) drug therapy: Secondary | ICD-10-CM | POA: Diagnosis not present

## 2016-12-15 DIAGNOSIS — J441 Chronic obstructive pulmonary disease with (acute) exacerbation: Secondary | ICD-10-CM

## 2016-12-15 LAB — CBC
HEMATOCRIT: 40.8 % (ref 35.0–47.0)
HEMOGLOBIN: 13.8 g/dL (ref 12.0–16.0)
MCH: 29.6 pg (ref 26.0–34.0)
MCHC: 33.8 g/dL (ref 32.0–36.0)
MCV: 87.4 fL (ref 80.0–100.0)
Platelets: 204 10*3/uL (ref 150–440)
RBC: 4.67 MIL/uL (ref 3.80–5.20)
RDW: 13.1 % (ref 11.5–14.5)
WBC: 6.4 10*3/uL (ref 3.6–11.0)

## 2016-12-15 LAB — COMPREHENSIVE METABOLIC PANEL
ALBUMIN: 3.6 g/dL (ref 3.5–5.0)
ALK PHOS: 101 U/L (ref 38–126)
ALT: 35 U/L (ref 14–54)
ANION GAP: 9 (ref 5–15)
AST: 43 U/L — AB (ref 15–41)
BILIRUBIN TOTAL: 0.4 mg/dL (ref 0.3–1.2)
BUN: 8 mg/dL (ref 6–20)
CO2: 31 mmol/L (ref 22–32)
Calcium: 9.3 mg/dL (ref 8.9–10.3)
Chloride: 101 mmol/L (ref 101–111)
Creatinine, Ser: 0.63 mg/dL (ref 0.44–1.00)
GFR calc Af Amer: >60 mL/min (ref >60)
GFR calc non Af Amer: >60 mL/min (ref >60)
Glucose, Bld: 107 mg/dL — ABNORMAL HIGH (ref 65–99)
Potassium: 2.9 mmol/L — ABNORMAL LOW (ref 3.5–5.1)
SODIUM: 141 mmol/L (ref 135–145)
Total Protein: 8.3 g/dL — ABNORMAL HIGH (ref 6.5–8.1)

## 2016-12-15 LAB — TROPONIN I

## 2016-12-15 MED ORDER — MORPHINE SULFATE (PF) 2 MG/ML IV SOLN
2.0000 mg | Freq: Once | INTRAVENOUS | Status: AC
  Administered 2016-12-15: 2 mg via INTRAVENOUS
  Filled 2016-12-15: qty 1

## 2016-12-15 MED ORDER — HYDROCOD POLST-CPM POLST ER 10-8 MG/5ML PO SUER
5.0000 mL | Freq: Once | ORAL | Status: AC
  Administered 2016-12-15: 5 mL via ORAL
  Filled 2016-12-15: qty 5

## 2016-12-15 MED ORDER — METHYLPREDNISOLONE SODIUM SUCC 125 MG IJ SOLR
125.0000 mg | Freq: Once | INTRAMUSCULAR | Status: AC
  Administered 2016-12-15: 125 mg via INTRAVENOUS
  Filled 2016-12-15: qty 2

## 2016-12-15 MED ORDER — ONDANSETRON HCL 4 MG/2ML IJ SOLN
4.0000 mg | Freq: Once | INTRAMUSCULAR | Status: AC
  Administered 2016-12-15: 4 mg via INTRAVENOUS

## 2016-12-15 MED ORDER — ONDANSETRON HCL 4 MG/2ML IJ SOLN
INTRAMUSCULAR | Status: AC
  Administered 2016-12-15: 4 mg via INTRAVENOUS
  Filled 2016-12-15: qty 2

## 2016-12-15 MED ORDER — IPRATROPIUM-ALBUTEROL 0.5-2.5 (3) MG/3ML IN SOLN
3.0000 mL | Freq: Once | RESPIRATORY_TRACT | Status: AC
  Administered 2016-12-15: 3 mL via RESPIRATORY_TRACT
  Filled 2016-12-15: qty 3

## 2016-12-15 MED ORDER — PREDNISONE 20 MG PO TABS: 40.0000 mg | ORAL_TABLET | Freq: Every day | ORAL | 0 refills | Status: DC

## 2016-12-15 MED ORDER — SODIUM CHLORIDE 0.9 % IV BOLUS (SEPSIS)
500.0000 mL | Freq: Once | INTRAVENOUS | Status: AC
  Administered 2016-12-15: 500 mL via INTRAVENOUS

## 2016-12-15 MED ORDER — AZITHROMYCIN 250 MG PO TABS: ORAL_TABLET | ORAL | 0 refills | Status: AC

## 2016-12-15 MED ORDER — ALBUTEROL SULFATE (2.5 MG/3ML) 0.083% IN NEBU
5.0000 mg | INHALATION_SOLUTION | Freq: Once | RESPIRATORY_TRACT | Status: AC
  Administered 2016-12-15: 5 mg via RESPIRATORY_TRACT
  Filled 2016-12-15: qty 6

## 2016-12-15 MED ORDER — HYDROCOD POLST-CPM POLST ER 10-8 MG/5ML PO SUER: 5.0000 mL | Freq: Two times a day (BID) | ORAL | 0 refills | Status: DC | PRN

## 2016-12-15 MED ORDER — ONDANSETRON HCL 4 MG/2ML IJ SOLN
4.0000 mg | Freq: Once | INTRAMUSCULAR | Status: AC
  Administered 2016-12-15: 4 mg via INTRAVENOUS
  Filled 2016-12-15: qty 2

## 2016-12-15 NOTE — ED Triage Notes (Signed)
Pt ambulatory to triage with sob for 1 day.  Hx copd.  Pt coughing.  Pt has chest tightness.  Pt taken to room 7

## 2016-12-15 NOTE — ED Provider Notes (Signed)
[facility] Emergency Department Provider Note  Time seen: 9:29 PM  I have reviewed the triage vital signs and the nursing notes.   HISTORY  Chief Complaint Shortness of Breath    HPI Kathleen Woodard is a 56 y.o. female with a past medical history of substance use hypertension, anxiety, COPD who presents to the emergency department for shortness of breath cough and fever. According to the patient for the past one week she has had a fever at home as high as 102 along with cough congestion and shortness of breath. States shortness breath was worse today and she noticed some blood tinge in her sputum so she came to the emergency department for evaluation. She also is describing some chest discomfort which she relates to coughing so much. Patient is quite anxious appearing.  Past Medical History:  Diagnosis Date  . COPD (chronic obstructive pulmonary disease) [facility])     Patient Active Problem List   Diagnosis Date Noted  . Tobacco abuse disorder 06/15/2016  . Cocaine use disorder, mild, abuse 06/11/2016  . Borderline personality disorder 06/11/2016  . Suicidal behavior 06/10/2016  . Overdose 06/10/2016  . Ischemic colitis (HCC) 11/10/2015  . Essential hypertension 05/11/2015  . Migraine without aura and responsive to treatment 03/22/2015  . Chronic peptic ulcer with hemorrhage but without obstruction 03/22/2015  . Incontinence 03/22/2015  . Anxiety, generalized 03/22/2015  . History of colon polyps 03/22/2015  . Idiopathic insomnia 03/22/2015  . Chronic obstructive pulmonary emphysema (HCC) 03/22/2015  . Muscle spasms of neck 03/22/2015    Past Surgical History:  Procedure Laterality Date  . CHOLECYSTECTOMY    . COLONOSCOPY  2013   hyperplastic polyp  . ESOPHAGOGASTRODUODENOSCOPY  2013   gastritis  . LUMBAR FUSION    . TOTAL ABDOMINAL HYSTERECTOMY  1985  . Vocal cord polyp resection      Prior to Admission medications   Medication Sig Start  Date End Date Taking? Authorizing Provider  albuterol (PROVENTIL HFA;VENTOLIN HFA) 108 (90 Base) MCG/ACT inhaler Inhale 2 puffs into the lungs every 6 (six) hours as needed for wheezing or shortness of breath. 04/26/16   Reubin Milan, MD  albuterol (PROVENTIL HFA;VENTOLIN HFA) 108 (90 Base) MCG/ACT inhaler Inhale 2 puffs into the lungs every 6 (six) hours as needed for wheezing or shortness of breath. 11/20/16   Nita Sickle, MD  albuterol (PROVENTIL) (2.5 MG/3ML) 0.083% nebulizer solution Inhale 3 mLs (2.5 mg total) into the lungs every 4 (four) hours as needed for wheezing or shortness of breath. 05/02/15   Reubin Milan, MD  amLODipine (NORVASC) 5 MG tablet Take 1 tablet (5 mg total) by mouth daily. 11/26/16   Reubin Milan, MD  ASPIRIN LOW DOSE 81 MG chewable tablet  08/14/15   [provider]  bisacodyl (DULCOLAX) 5 MG EC tablet Take by mouth.    [provider]  cyclobenzaprine (FLEXERIL) 10 MG tablet Take 1 tablet (10 mg total) by mouth at bedtime. 11/20/16   Nita Sickle, MD  metoprolol succinate (TOPROL-XL) 50 MG 24 hr tablet Take 50 mg by mouth daily. 06/15/16 07/15/16  [provider]  mometasone-formoterol (DULERA) 200-5 MCG/ACT AERO Inhale 2 puffs into the lungs 2 (two) times daily. 11/14/15   Reubin Milan, MD  omeprazole (PRILOSEC) 40 MG capsule Take 1 capsule by mouth daily. 10/13/15   [provider]  promethazine (PHENERGAN) 25 MG tablet TAKE ONE TABLET BY MOUTH 4 TIMES DAILY AS NEEDED 03/28/16   Judithann Graves,  Nyoka Cowden, MD  venlafaxine XR (EFFEXOR-XR) 75 MG 24 hr capsule  11/13/15   [provider]    Allergies  Allergen Reactions  . Triptans Shortness Of Breath    Imitrex  . Ciprofloxacin Hives  . Codeine Diarrhea, Nausea Only and Nausea And Vomiting  . Sumatriptan Succinate Other (See Comments)  . Zolpidem Other (See Comments)    Confusion    Family History  Problem Relation Age of Onset  . Stroke Mother   . Cancer  Mother   . Heart failure Father     Social History Social History  Substance Use Topics  . Smoking status: Former Games developer  . Smokeless tobacco: Never Used  . Alcohol use No    Review of Systems Constitutional: Patient reports fever to 102 at home, afebrile in the emergency department. Cardiovascular: Chest discomfort today with cough. Respiratory: Moderate shortness of breath with cough and wheeze. Gastrointestinal: Negative for abdominal pain. Negative for nausea and vomiting or diarrhea. Genitourinary: Negative for dysuria. Musculoskeletal: Negative for back pain. Neurological: Negative for headache All other ROS negative  ____________________________________________   PHYSICAL EXAM:  VITAL SIGNS: ED Triage Vitals  Enc Vitals Group     BP 12/15/16 2011 (!) 154/105     Pulse Rate 12/15/16 2011 (!) 126     Resp 12/15/16 2011 (!) 28     Temp 12/15/16 2011 98.7 F (37.1 C)     Temp Source 12/15/16 2011 Oral     SpO2 12/15/16 2011 90 %     Weight 12/15/16 2012 125 lb (56.7 kg)     Height 12/15/16 2012 5\' 3"  (1.6 m)     Head Circumference --      Peak Flow --      Pain Score 12/15/16 2010 10     Pain Loc --      Pain Edu? --      Excl. in GC? --     Constitutional: Alert and oriented. Somewhat anxious appearing, jittery. Eyes: Normal exam ENT   Head: Normocephalic and atraumatic   Mouth/Throat: Mucous membranes are moist. Cardiovascular: Normal rate, regular rhythm, round 100 bpm. No obvious murmur. Respiratory: Mild tachypnea. The patient does have mild diffuse expiratory wheeze. No obvious rales or rhonchi. Gastrointestinal: Soft and nontender. No distention.   Musculoskeletal: Nontender with normal range of motion in all extremities.  Neurologic:  Normal speech and language. No gross focal neurologic deficits Skin:  Skin is warm, dry and intact.  Psychiatric: Mood and affect are normal.   ____________________________________________    EKG  EKG  reviewed and interpreted by myself shows sinus tachycardia 107 bpm, narrow QRS, normal axis, normal intervals, no concerning ST changes.  ____________________________________________    RADIOLOGY  Chest x-ray negative  ____________________________________________   INITIAL IMPRESSION / ASSESSMENT AND PLAN / ED COURSE  Pertinent labs & imaging results that were available during my care of the patient were reviewed by me and considered in my medical decision making (see chart for details).  Patient presents to the emergency department with cough, congestion, reported fever. States symptoms have been ongoing for the past one week. Patient states she was prescribed oxygen for home use but approximately one year ago discontinued this and has not been wearing oxygen for over one year. Has no access to oxygen at home. Currently 93-94% on room air. Mild tachypnea with diffuse mild expiratory wheeze. We'll treat with 2 nebs, Solu-Medrol and dose Tussionex. We will check labs. Chest x-ray is negative. EKG is  reassuring.  Patient's labs are normal. Chest x-ray is normal. Patient does have mild expiratory wheeze likely COPD exacerbation. Patient's room air saturation is 94%. We will discharge the patient home and cover with antibiotics, prednisone and cough medication. Patient agreeable with plan.  ____________________________________________   FINAL CLINICAL IMPRESSION(S) / ED DIAGNOSES  Dyspnea COPD exacerbation   Minna AntisPaduchowski, Alexanderjames Berg, MD 12/15/16 2311

## 2016-12-16 ENCOUNTER — Encounter: Payer: Self-pay | Admitting: Internal Medicine

## 2016-12-18 ENCOUNTER — Ambulatory Visit: Payer: Self-pay | Admitting: Internal Medicine

## 2017-01-19 ENCOUNTER — Emergency Department
Admission: EM | Admit: 2017-01-19 | Discharge: 2017-01-20 | Disposition: A | Discharge status: Home / Self Care | Payer: BLUE CROSS/BLUE SHIELD | Attending: Emergency Medicine | Admitting: Emergency Medicine

## 2017-01-19 ENCOUNTER — Emergency Department: Payer: BLUE CROSS/BLUE SHIELD

## 2017-01-19 ENCOUNTER — Encounter: Payer: Self-pay | Admitting: Emergency Medicine

## 2017-01-19 DIAGNOSIS — Y999 Unspecified external cause status: Secondary | ICD-10-CM | POA: Insufficient documentation

## 2017-01-19 DIAGNOSIS — W010XXA Fall on same level from slipping, tripping and stumbling without subsequent striking against object, initial encounter: Secondary | ICD-10-CM | POA: Insufficient documentation

## 2017-01-19 DIAGNOSIS — Z79899 Other long term (current) drug therapy: Secondary | ICD-10-CM | POA: Diagnosis not present

## 2017-01-19 DIAGNOSIS — Y939 Activity, unspecified: Secondary | ICD-10-CM | POA: Diagnosis not present

## 2017-01-19 DIAGNOSIS — Y929 Unspecified place or not applicable: Secondary | ICD-10-CM | POA: Diagnosis not present

## 2017-01-19 DIAGNOSIS — S6991XA Unspecified injury of right wrist, hand and finger(s), initial encounter: Secondary | ICD-10-CM | POA: Diagnosis present

## 2017-01-19 DIAGNOSIS — S62646A Nondisplaced fracture of proximal phalanx of right little finger, initial encounter for closed fracture: Secondary | ICD-10-CM | POA: Insufficient documentation

## 2017-01-19 DIAGNOSIS — I10 Essential (primary) hypertension: Secondary | ICD-10-CM | POA: Diagnosis not present

## 2017-01-19 DIAGNOSIS — W19XXXA Unspecified fall, initial encounter: Secondary | ICD-10-CM

## 2017-01-19 DIAGNOSIS — J449 Chronic obstructive pulmonary disease, unspecified: Secondary | ICD-10-CM | POA: Diagnosis not present

## 2017-01-19 MED ORDER — KETOROLAC TROMETHAMINE 60 MG/2ML IM SOLN
60.0000 mg | Freq: Once | INTRAMUSCULAR | Status: AC
  Administered 2017-01-20: 60 mg via INTRAMUSCULAR
  Filled 2017-01-19: qty 2

## 2017-01-19 MED ORDER — ONDANSETRON 4 MG PO TBDP
4.0000 mg | ORAL_TABLET | Freq: Once | ORAL | Status: AC
  Administered 2017-01-20: 4 mg via ORAL
  Filled 2017-01-19: qty 1

## 2017-01-19 MED ORDER — OXYCODONE-ACETAMINOPHEN 5-325 MG PO TABS
2.0000 | ORAL_TABLET | Freq: Once | ORAL | Status: AC
  Administered 2017-01-20: 2 via ORAL
  Filled 2017-01-19: qty 2

## 2017-01-19 NOTE — ED Provider Notes (Signed)
[facility] Emergency Department Provider Note   ____________________________________________   First MD Initiated Contact with Patient 01/19/17 2316     (approximate)  I have reviewed the triage vital signs and the nursing notes.   HISTORY  Chief Complaint Fall    HPI Kathleen Woodard is a 56 y.o. female who comes into the hospital today with a fall. The patient reports that she stepped out of the car and stumbled over a curb. She reports that she caught herself by putting out her hands. She states that her right hand bent backwards and she hit her knee and right shoulder. She reports that she has some significant pain to her right fifth digit. She reports that the pain was so severe that she vomited 4 times. Her right pinky is swollen and bruised. The patient rates her pain a 10 out of 10 in intensity currently. The patient reports that she is unable to move her hand and her pinky. She is here today for evaluation of this injury.   Past Medical History:  Diagnosis Date  . COPD (chronic obstructive pulmonary disease) [facility])     Patient Active Problem List   Diagnosis Date Noted  . Tobacco abuse disorder 06/15/2016  . Cocaine use disorder, mild, abuse 06/11/2016  . Borderline personality disorder 06/11/2016  . Suicidal behavior 06/10/2016  . Overdose 06/10/2016  . Ischemic colitis (HCC) 11/10/2015  . Essential hypertension 05/11/2015  . Migraine without aura and responsive to treatment 03/22/2015  . Chronic peptic ulcer with hemorrhage but without obstruction 03/22/2015  . Incontinence 03/22/2015  . Anxiety, generalized 03/22/2015  . History of colon polyps 03/22/2015  . Idiopathic insomnia 03/22/2015  . Chronic obstructive pulmonary emphysema (HCC) 03/22/2015  . Muscle spasms of neck 03/22/2015    Past Surgical History:  Procedure Laterality Date  . CHOLECYSTECTOMY    . COLONOSCOPY  2013   hyperplastic polyp  . ESOPHAGOGASTRODUODENOSCOPY   2013   gastritis  . LUMBAR FUSION    . TOTAL ABDOMINAL HYSTERECTOMY  1985  . Vocal cord polyp resection      Prior to Admission medications   Medication Sig Start Date End Date Taking? Authorizing Provider  albuterol (PROVENTIL HFA;VENTOLIN HFA) 108 (90 Base) MCG/ACT inhaler Inhale 2 puffs into the lungs every 6 (six) hours as needed for wheezing or shortness of breath. 04/26/16   Reubin Milan, MD  albuterol (PROVENTIL HFA;VENTOLIN HFA) 108 (90 Base) MCG/ACT inhaler Inhale 2 puffs into the lungs every 6 (six) hours as needed for wheezing or shortness of breath. 11/20/16   Nita Sickle, MD  albuterol (PROVENTIL) (2.5 MG/3ML) 0.083% nebulizer solution Inhale 3 mLs (2.5 mg total) into the lungs every 4 (four) hours as needed for wheezing or shortness of breath. 05/02/15   Reubin Milan, MD  amLODipine (NORVASC) 5 MG tablet Take 1 tablet (5 mg total) by mouth daily. 11/26/16   Reubin Milan, MD  ASPIRIN LOW DOSE 81 MG chewable tablet  08/14/15   [provider]  bisacodyl (DULCOLAX) 5 MG EC tablet Take by mouth.    [provider]  chlorpheniramine-HYDROcodone (TUSSIONEX PENNKINETIC ER) 10-8 MG/5ML SUER Take 5 mLs by mouth every 12 (twelve) hours as needed for cough. 12/15/16   Minna Antis, MD  cyclobenzaprine (FLEXERIL) 10 MG tablet Take 1 tablet (10 mg total) by mouth at bedtime. 11/20/16   Nita Sickle, MD  etodolac (LODINE) 200 MG capsule Take 1 capsule (200 mg total) by mouth every 8 (eight)  hours. 01/20/17   Rebecka Apley, MD  metoprolol succinate (TOPROL-XL) 50 MG 24 hr tablet Take 50 mg by mouth daily. 06/15/16 07/15/16  [provider]  mometasone-formoterol (DULERA) 200-5 MCG/ACT AERO Inhale 2 puffs into the lungs 2 (two) times daily. 11/14/15   Reubin Milan, MD  omeprazole (PRILOSEC) 40 MG capsule Take 1 capsule by mouth daily. 10/13/15   [provider]  ondansetron (ZOFRAN ODT) 4 MG disintegrating tablet Take 1 tablet (4 mg  total) by mouth every 8 (eight) hours as needed for nausea or vomiting. 01/20/17   Rebecka Apley, MD  predniSONE (DELTASONE) 20 MG tablet Take 2 tablets (40 mg total) by mouth daily. 12/15/16   Minna Antis, MD  promethazine (PHENERGAN) 25 MG tablet TAKE ONE TABLET BY MOUTH 4 TIMES DAILY AS NEEDED 03/28/16   Reubin Milan, MD  venlafaxine XR (EFFEXOR-XR) 75 MG 24 hr capsule  11/13/15   [provider]    Allergies Triptans; Ciprofloxacin; Codeine; Sumatriptan succinate; and Zolpidem  Family History  Problem Relation Age of Onset  . Stroke Mother   . Cancer Mother   . Heart failure Father     Social History Social History  Substance Use Topics  . Smoking status: Former Games developer  . Smokeless tobacco: Never Used  . Alcohol use No    Review of Systems  Constitutional: No fever/chills Eyes: No visual changes. ENT: No sore throat. Cardiovascular: Denies chest pain. Respiratory: Denies shortness of breath. Gastrointestinal: No abdominal pain.  No nausea, no vomiting.  No diarrhea.  No constipation. Genitourinary: Negative for dysuria. Musculoskeletal: Right hand pain and swelling Skin: Negative for rash. Neurological: Negative for headaches, focal weakness or numbness.   ____________________________________________   PHYSICAL EXAM:  VITAL SIGNS: ED Triage Vitals  Enc Vitals Group     BP 01/19/17 2108 (!) 146/70     Pulse Rate 01/19/17 2108 75     Resp 01/19/17 2108 18     Temp 01/19/17 2108 97.7 F (36.5 C)     Temp Source 01/19/17 2108 Oral     SpO2 01/19/17 2108 97 %     Weight 01/19/17 2106 135 lb (61.2 kg)     Height 01/19/17 2106 5\' 2"  (1.575 m)     Head Circumference --      Peak Flow --      Pain Score 01/19/17 2103 10     Pain Loc --      Pain Edu? --      Excl. in GC? --     Constitutional: Alert and oriented. Well appearing and in Moderate distress. Eyes: Conjunctivae are normal. PERRL. EOMI. Head: Atraumatic. Nose: No  congestion/rhinnorhea. Mouth/Throat: Mucous membranes are moist.  Oropharynx non-erythematous. Neck: No cervical spine tenderness to palpation. Cardiovascular: Normal rate, regular rhythm. Grossly normal heart sounds.  Good peripheral circulation. Respiratory: Normal respiratory effort.  No retractions. Lungs CTAB. Gastrointestinal: Soft and nontender. No distention. Positive bowel sounds Musculoskeletal: Swelling and bruising to right fifth digit at the proximal phalanx as well as the hand. Pain with palpation of fingers with color intact motion intact but some diminished sensation. Neurologic:  Normal speech and language.  Skin:  Skin is warm, dry and intact.  Psychiatric: Mood and affect are normal.   ____________________________________________   LABS (all labs ordered are listed, but only abnormal results are displayed)  Labs Reviewed - No data to display ____________________________________________  EKG  none ____________________________________________  RADIOLOGY  Dg Hand Complete Right  Result  Date: 01/19/2017 CLINICAL DATA:  Tripped over curb, with injury to the right third, fourth and fifth fingers. Associated pain. Initial encounter. EXAM: RIGHT HAND - COMPLETE 3+ VIEW COMPARISON:  None. FINDINGS: There is an oblique fracture through the proximal aspect of the fifth proximal phalanx, with minimal displacement and slight dorsal angulation. Intra-articular extension is noted. Surrounding soft tissue swelling is noted. A 4 mm metallic fragment is noted within the soft tissues ulnar to the fifth metacarpal. IMPRESSION: 1. Oblique fracture through the proximal aspect of the fifth proximal phalanx, with minimal displacement and slight dorsal angulation. Intra-articular extension noted. 2. 4 mm metallic fragment within the soft tissues ulnar to the fifth metacarpal. Electronically Signed   By: Roanna RaiderJeffery  Chang M.D.   On: 01/19/2017 21:56     ____________________________________________   PROCEDURES  Procedure(s) performed: None  .Splint Application Date/Time: 01/20/2017 12:16 AM Performed by: Rebecka ApleyWEBSTER, ALLISON P Authorized by: Rebecka ApleyWEBSTER, ALLISON P   Consent:    Consent obtained:  Verbal   Consent given by:  Patient Pre-procedure details:    Sensation:  Numbness Procedure details:    Laterality:  Right   Location:  Finger   Finger:  R small finger   Cast type:  Short arm   Splint type:  Ulnar gutter   Supplies:  Ortho-Glass Post-procedure details:    Pain:  Unchanged   Sensation:  Normal   Skin color:  Pink   Patient tolerance of procedure:  Tolerated well, no immediate complications    Critical Care performed: No  ____________________________________________   INITIAL IMPRESSION / ASSESSMENT AND PLAN / ED COURSE  Pertinent labs & imaging results that were available during my care of the patient were reviewed by me and considered in my medical decision making (see chart for details).  This is a 56 year old female who comes into the hospital today after a fall. The patient has some bruising and pain to her right fifth digit. She received an x-ray which showed an oblique fracture of the proximal phalanx of her fifth digit. There is also a noted foreign body but there is no area of broken skin on the patient's hand. I did give the patient a shot of Toradol as well as some Zofran and Percocet. The patient will receive a splint to her hand and she will then be dispositioned. I did look into the patient's prior hospital notes and it appears that [facility] 6 days [facility] with a fracture to her foot.  I did write the patient a prescription for some etodolac as well as Zofran. She will be discharged to home. The patient should follow-up with orthopedic surgery.      ____________________________________________   FINAL CLINICAL IMPRESSION(S) / ED DIAGNOSES  Final diagnoses:   Closed nondisplaced fracture of proximal phalanx of right little finger, initial encounter  Fall, initial encounter      NEW MEDICATIONS STARTED DURING THIS VISIT:  New Prescriptions   ETODOLAC (LODINE) 200 MG CAPSULE    Take 1 capsule (200 mg total) by mouth every 8 (eight) hours.   ONDANSETRON (ZOFRAN ODT) 4 MG DISINTEGRATING TABLET    Take 1 tablet (4 mg total) by mouth every 8 (eight) hours as needed for nausea or vomiting.     Note:  This document was prepared using Dragon voice recognition software and may include unintentional dictation errors.    Rebecka ApleyWebster, Allison P, MD 01/20/17 [phone]
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
## 2017-01-19 NOTE — ED Triage Notes (Signed)
Pt arrives via [facility] with c/o right hand pain. Pt fell today landing on her fingers. Pt denies head trauma or LOC. Pt has swollen and blue right hand.

## 2017-01-19 NOTE — ED Notes (Signed)
ED Provider at bedside. 

## 2017-01-20 MED ORDER — ETODOLAC 200 MG PO CAPS: 200.0000 mg | ORAL_CAPSULE | Freq: Three times a day (TID) | ORAL | 0 refills | Status: DC

## 2017-01-20 MED ORDER — ONDANSETRON 4 MG PO TBDP: 4.0000 mg | ORAL_TABLET | Freq: Three times a day (TID) | ORAL | 0 refills | Status: AC | PRN

## 2017-01-20 NOTE — Discharge Instructions (Signed)
PLease follow up with orthopedic surgery for further evaluation.

## 2017-02-27 ENCOUNTER — Other Ambulatory Visit: Payer: Self-pay | Admitting: Internal Medicine

## 2017-02-27 ENCOUNTER — Encounter: Payer: Self-pay | Admitting: Internal Medicine

## 2017-02-27 ENCOUNTER — Ambulatory Visit (INDEPENDENT_AMBULATORY_CARE_PROVIDER_SITE_OTHER): Payer: BLUE CROSS/BLUE SHIELD | Admitting: Internal Medicine

## 2017-02-27 VITALS — BP 128/78 | HR 86 | Ht 62.0 in | Wt 135.0 lb

## 2017-02-27 DIAGNOSIS — F603 Borderline personality disorder: Secondary | ICD-10-CM | POA: Diagnosis not present

## 2017-02-27 DIAGNOSIS — S62646A Nondisplaced fracture of proximal phalanx of right little finger, initial encounter for closed fracture: Secondary | ICD-10-CM | POA: Diagnosis not present

## 2017-02-27 DIAGNOSIS — I10 Essential (primary) hypertension: Secondary | ICD-10-CM | POA: Diagnosis not present

## 2017-02-27 DIAGNOSIS — J439 Emphysema, unspecified: Secondary | ICD-10-CM

## 2017-02-27 DIAGNOSIS — M62838 Other muscle spasm: Secondary | ICD-10-CM | POA: Diagnosis not present

## 2017-02-27 DIAGNOSIS — K559 Vascular disorder of intestine, unspecified: Secondary | ICD-10-CM

## 2017-02-27 DIAGNOSIS — G43519 Persistent migraine aura without cerebral infarction, intractable, without status migrainosus: Secondary | ICD-10-CM

## 2017-02-27 DIAGNOSIS — Z1231 Encounter for screening mammogram for malignant neoplasm of breast: Secondary | ICD-10-CM | POA: Diagnosis not present

## 2017-02-27 DIAGNOSIS — J438 Other emphysema: Secondary | ICD-10-CM

## 2017-02-27 DIAGNOSIS — Z1239 Encounter for other screening for malignant neoplasm of breast: Secondary | ICD-10-CM

## 2017-02-27 MED ORDER — AMLODIPINE BESYLATE 10 MG PO TABS: 10.0000 mg | ORAL_TABLET | Freq: Every day | ORAL | 1 refills | Status: AC

## 2017-02-27 MED ORDER — BUTALBITAL-APAP-CAFFEINE 50-325-40 MG PO TABS: 1.0000 | ORAL_TABLET | Freq: Four times a day (QID) | ORAL | 0 refills | Status: AC | PRN

## 2017-02-27 MED ORDER — ALBUTEROL SULFATE HFA 108 (90 BASE) MCG/ACT IN AERS: 2.0000 | INHALATION_SPRAY | Freq: Four times a day (QID) | RESPIRATORY_TRACT | 5 refills | Status: AC | PRN

## 2017-02-27 MED ORDER — CYCLOBENZAPRINE HCL 10 MG PO TABS
10.0000 mg | ORAL_TABLET | Freq: Every day | ORAL | 5 refills | Status: AC
Start: 2017-02-27 — End: ?

## 2017-02-27 MED ORDER — METOPROLOL SUCCINATE ER 50 MG PO TB24: 50.0000 mg | ORAL_TABLET | Freq: Every day | ORAL | 1 refills | Status: AC
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
## 2018-01-08 DEATH — deceased
